# Patient Record
Sex: Male | Born: 1965 | Race: White | Hispanic: No | Marital: Married
Health system: Midwestern US, Community
[De-identification: ages and names within clinical notes are randomized; demographics above are authoritative.]

## PROBLEM LIST (undated history)

## (undated) DIAGNOSIS — T7840XA Allergy, unspecified, initial encounter: Secondary | ICD-10-CM

## (undated) DIAGNOSIS — Z9289 Personal history of other medical treatment: Secondary | ICD-10-CM

## (undated) DIAGNOSIS — Z973 Presence of spectacles and contact lenses: Secondary | ICD-10-CM

## (undated) DIAGNOSIS — K219 Gastro-esophageal reflux disease without esophagitis: Secondary | ICD-10-CM

## (undated) DIAGNOSIS — G5603 Carpal tunnel syndrome, bilateral upper limbs: Secondary | ICD-10-CM

## (undated) DIAGNOSIS — I7122 Aneurysm of the aortic arch, without rupture: Secondary | ICD-10-CM

## (undated) DIAGNOSIS — I2510 Atherosclerotic heart disease of native coronary artery without angina pectoris: Principal | ICD-10-CM

## (undated) DIAGNOSIS — Z1211 Encounter for screening for malignant neoplasm of colon: Secondary | ICD-10-CM

## (undated) DIAGNOSIS — S83242A Other tear of medial meniscus, current injury, left knee, initial encounter: Secondary | ICD-10-CM

## (undated) DIAGNOSIS — M75121 Complete rotator cuff tear or rupture of right shoulder, not specified as traumatic: Secondary | ICD-10-CM

## (undated) DIAGNOSIS — S83207A Unspecified tear of unspecified meniscus, current injury, left knee, initial encounter: Secondary | ICD-10-CM

## (undated) DIAGNOSIS — E7800 Pure hypercholesterolemia, unspecified: Secondary | ICD-10-CM

## (undated) HISTORY — DX: Gastro-esophageal reflux disease without esophagitis: K21.9

## (undated) HISTORY — DX: Personal history of other medical treatment: Z92.89

## (undated) HISTORY — DX: Presence of spectacles and contact lenses: Z97.3

## (undated) HISTORY — DX: Allergy, unspecified, initial encounter: T78.40XA

---

## 1998-05-10 ENCOUNTER — Emergency Department (HOSPITAL_COMMUNITY): Admission: EM | Admit: 1998-05-10 | Discharge: 1998-05-10 | Payer: Self-pay | Admitting: Emergency Medicine

## 1998-11-30 ENCOUNTER — Inpatient Hospital Stay (HOSPITAL_COMMUNITY): Admission: EM | Admit: 1998-11-30 | Discharge: 1998-12-04 | Payer: Self-pay | Admitting: Emergency Medicine

## 1998-11-30 ENCOUNTER — Encounter: Payer: Self-pay | Admitting: Emergency Medicine

## 1998-12-03 ENCOUNTER — Encounter: Payer: Self-pay | Admitting: Neurology

## 1999-08-18 ENCOUNTER — Inpatient Hospital Stay: Admission: EM | Admit: 1999-08-18 | Discharge: 1999-08-20 | Payer: Self-pay | Admitting: Emergency Medicine

## 1999-08-20 ENCOUNTER — Inpatient Hospital Stay (HOSPITAL_COMMUNITY): Admission: EM | Admit: 1999-08-20 | Discharge: 1999-08-23 | Payer: Self-pay | Admitting: *Deleted

## 2003-01-07 ENCOUNTER — Emergency Department (HOSPITAL_COMMUNITY): Admission: EM | Admit: 2003-01-07 | Discharge: 2003-01-07 | Payer: Self-pay | Admitting: Emergency Medicine

## 2003-12-09 ENCOUNTER — Emergency Department (HOSPITAL_COMMUNITY): Admission: EM | Admit: 2003-12-09 | Discharge: 2003-12-09 | Payer: Self-pay | Admitting: Emergency Medicine
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
## 2006-01-22 ENCOUNTER — Emergency Department (HOSPITAL_COMMUNITY): Admission: EM | Admit: 2006-01-22 | Discharge: 2006-01-22 | Payer: Self-pay | Admitting: Emergency Medicine

## 2006-10-29 ENCOUNTER — Emergency Department (HOSPITAL_COMMUNITY): Admission: EM | Admit: 2006-10-29 | Discharge: 2006-10-29 | Payer: Self-pay | Admitting: Emergency Medicine

## 2007-03-25 ENCOUNTER — Inpatient Hospital Stay (HOSPITAL_COMMUNITY): Admission: EM | Admit: 2007-03-25 | Discharge: 2007-03-26 | Payer: Self-pay | Admitting: Emergency Medicine

## 2007-03-25 ENCOUNTER — Ambulatory Visit: Payer: Self-pay | Admitting: Cardiology

## 2007-03-25 ENCOUNTER — Ambulatory Visit: Payer: Self-pay | Admitting: *Deleted

## 2007-03-25 ENCOUNTER — Encounter (INDEPENDENT_AMBULATORY_CARE_PROVIDER_SITE_OTHER): Payer: Self-pay | Admitting: *Deleted

## 2007-03-25 DIAGNOSIS — Z9289 Personal history of other medical treatment: Secondary | ICD-10-CM

## 2007-03-25 HISTORY — DX: Personal history of other medical treatment: Z92.89

## 2007-11-20 ENCOUNTER — Observation Stay (HOSPITAL_COMMUNITY): Admission: EM | Admit: 2007-11-20 | Discharge: 2007-11-22 | Payer: Self-pay | Admitting: Emergency Medicine

## 2008-12-12 IMAGING — CT CT HEAD W/O CM
1 of 2 series · 16 of 30 positions shown, 20 images · non-contrast
Comparison: Not available

CLINICAL DATA: Syncopal episode, slurred speech, left-sided weakness.

HEAD CT WITHOUT CONTRAST
TECHNIQUE: 5mm collimated images were obtained from the base of the skull
through the vertex according to standard protocol without contrast.

[Series 3: recon 2: brain · axial · 0.49mm/px · z∈[+151,+278]mm · 16 of 56 slices shown, 20 images]
[im 3/56  brain]
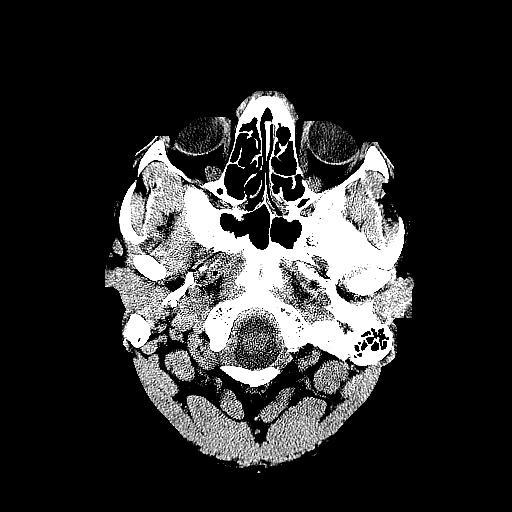
[im 3/56  bone]
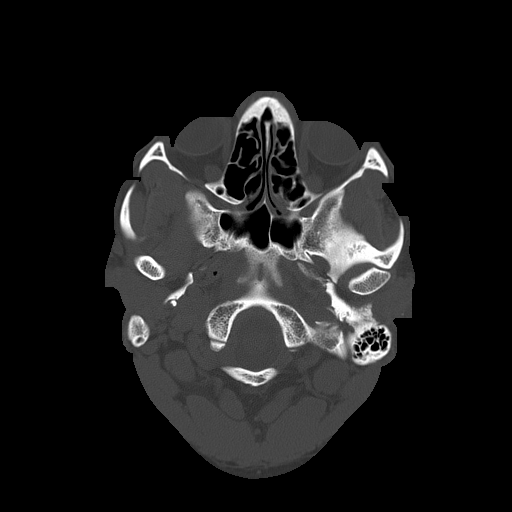
[im 6/56  brain]
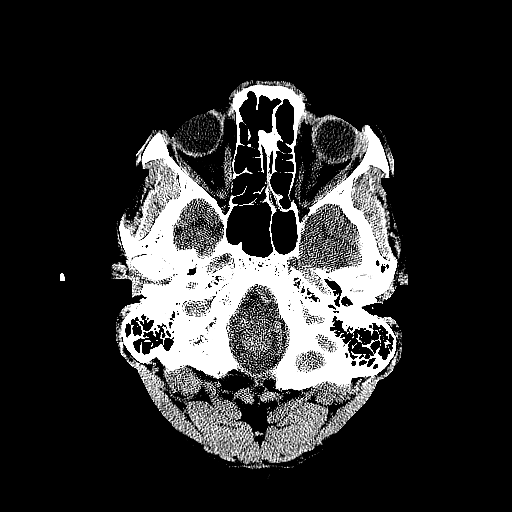
[im 9/56  brain]
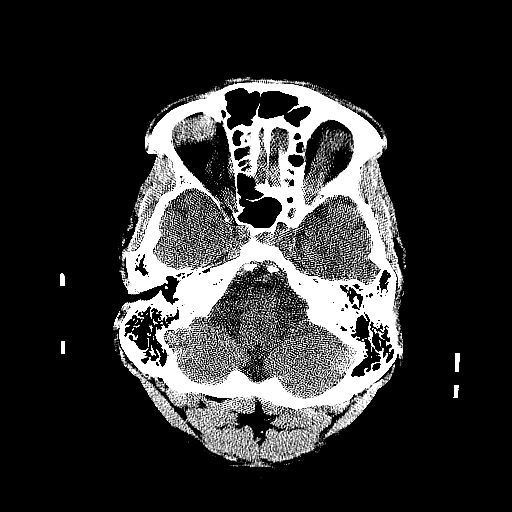
[im 12/56  brain]
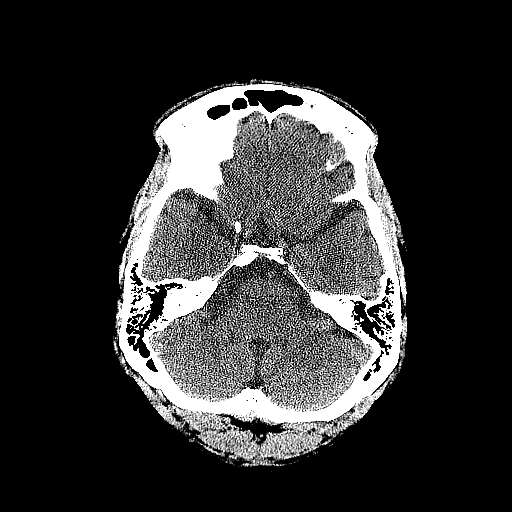
[im 18/56  brain]
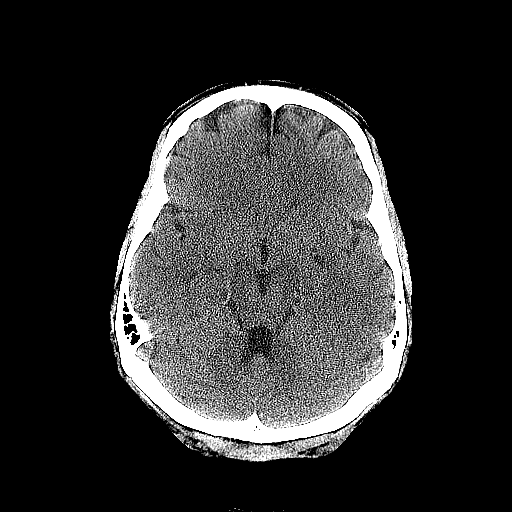
[im 18/56  bone]
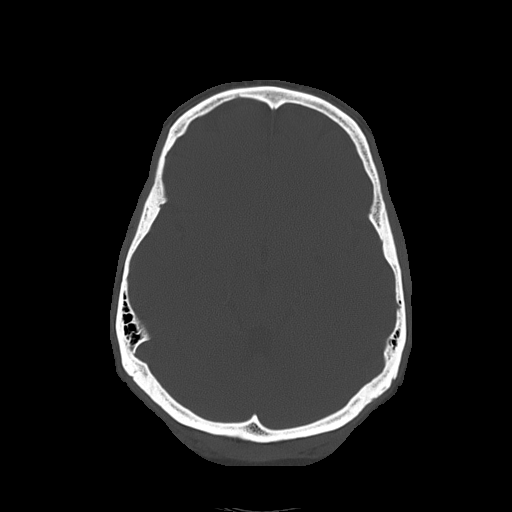
[im 21/56  brain]
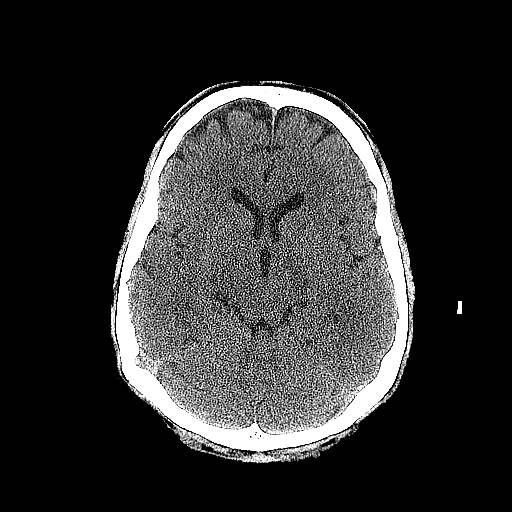
[im 24/56  brain]
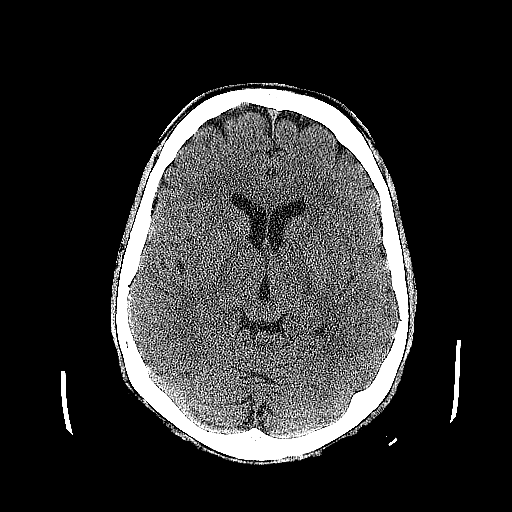
[im 27/56  brain]
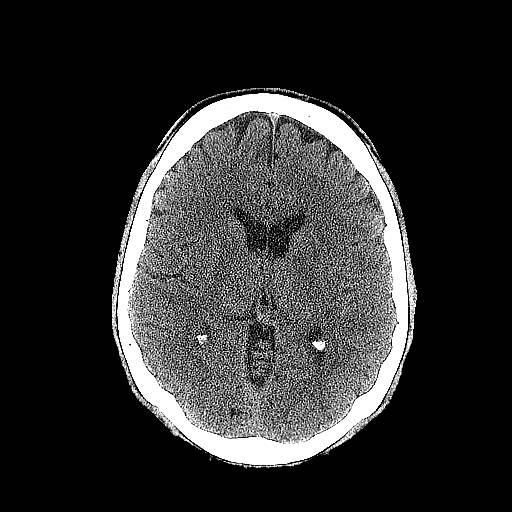
[im 29/56  brain]
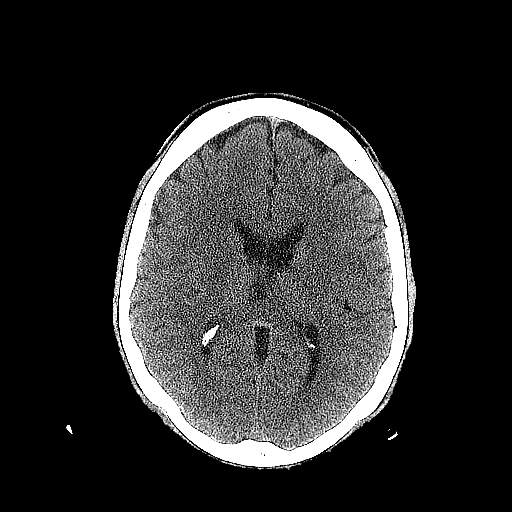
[im 29/56  bone]
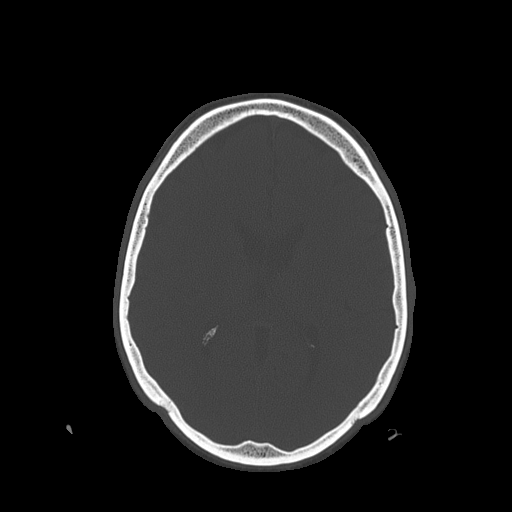
[im 32/56  brain]
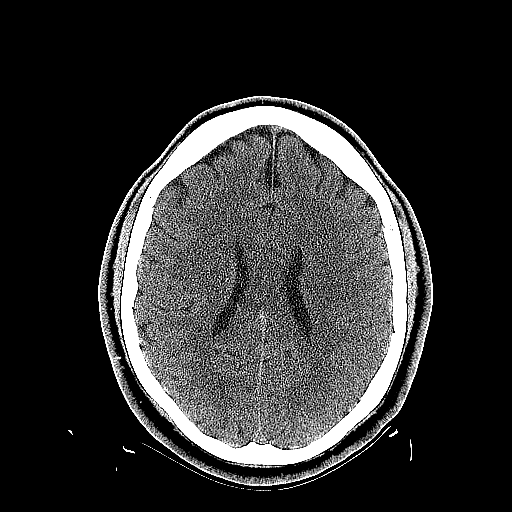
[im 35/56  brain]
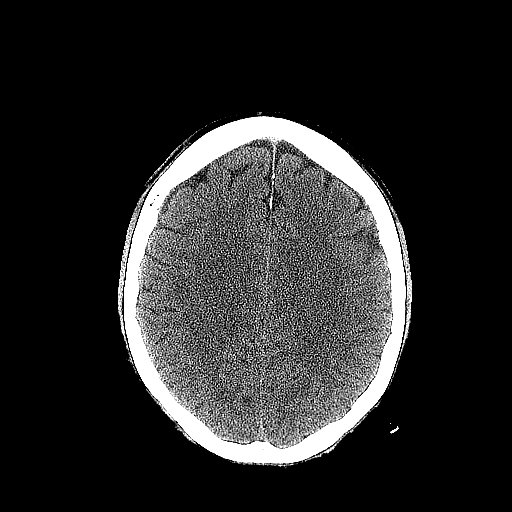
[im 38/56  brain]
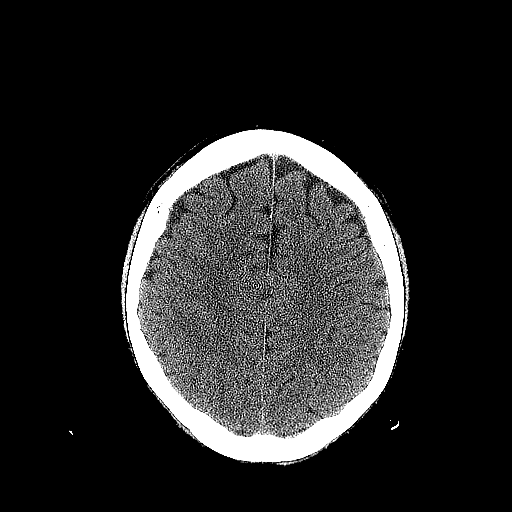
[im 44/56  brain]
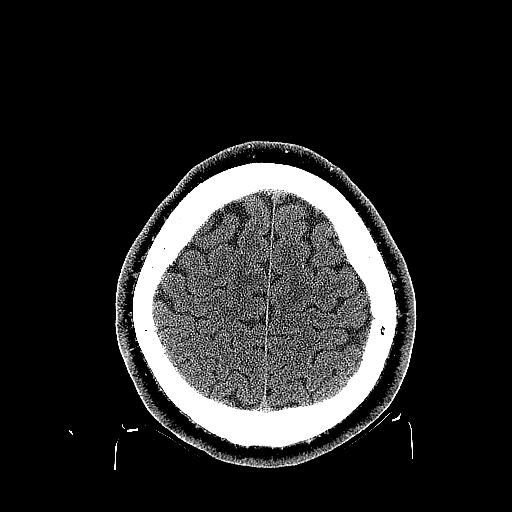
[im 44/56  bone]
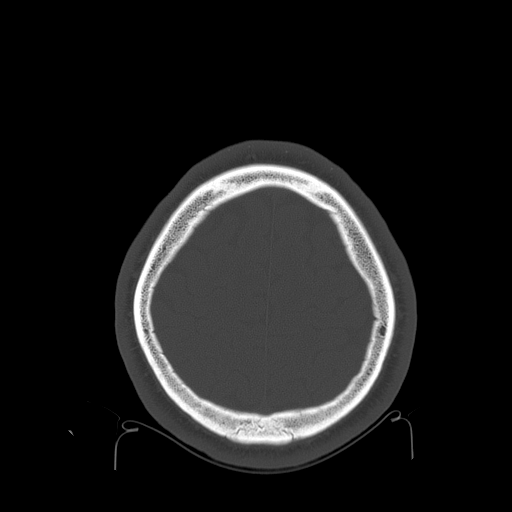
[im 47/56  brain]
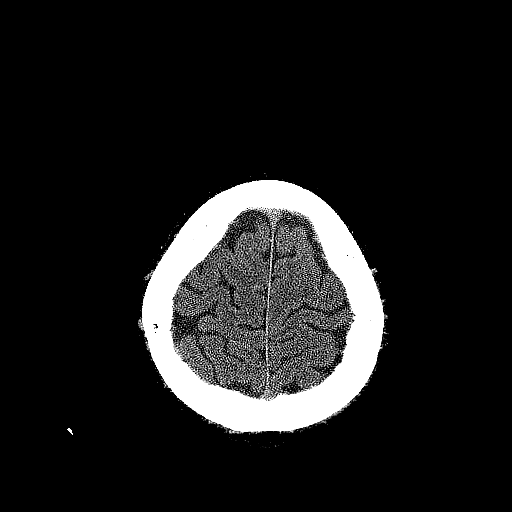
[im 50/56  brain]
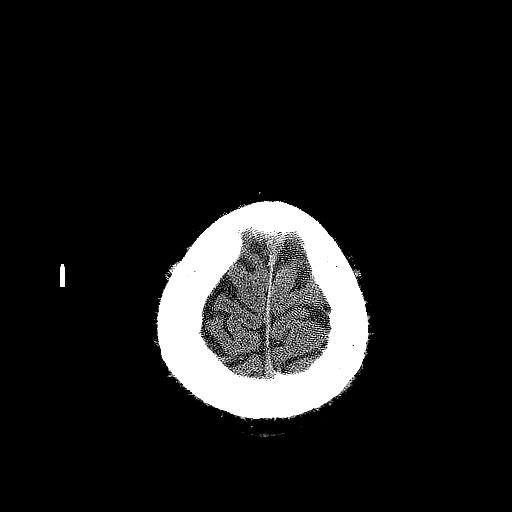
[im 53/56  brain]
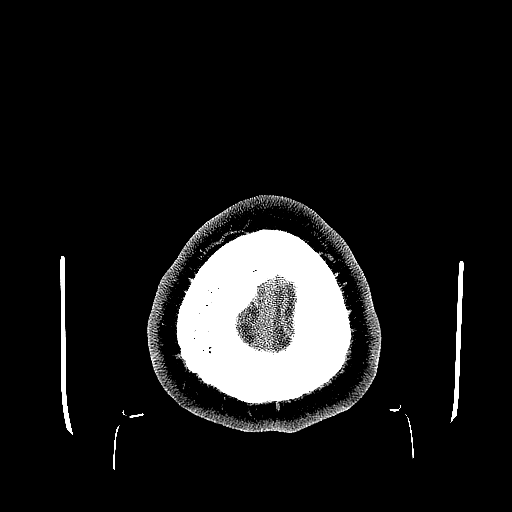

[16 of 30 positions shown; findings below may reference images not displayed]

FINDINGS: No mass, mass-effect, midline shift, hydrocephalus, or hemorrhage.
Ventricular system within normal limits. Benign basal ganglia calcifications.
Mild ethmoid mucosal is thickening.

IMPRESSION

No acute intracranial abnormality.

## 2010-01-30 ENCOUNTER — Emergency Department (HOSPITAL_COMMUNITY): Admission: EM | Admit: 2010-01-30 | Discharge: 2010-01-30 | Payer: Self-pay | Admitting: Emergency Medicine

## 2010-07-04 LAB — BASIC METABOLIC PANEL
Calcium: 9.3 mg/dL (ref 8.4–10.5)
GFR calc non Af Amer: 46 mL/min — ABNORMAL LOW (ref >60)
Potassium: 3.3 mEq/L — ABNORMAL LOW (ref 3.5–5.1)
Sodium: 138 mEq/L (ref 135–145)

## 2010-07-04 LAB — POCT CARDIAC MARKERS
CKMB, poc: 1.7 ng/mL (ref 1.0–8.0)
Myoglobin, poc: 109 ng/mL (ref 12–200)

## 2010-07-04 LAB — DIFFERENTIAL
Basophils Absolute: 0 10*3/uL (ref 0.0–0.1)
Eosinophils Absolute: 0.3 10*3/uL (ref 0.0–0.7)
Eosinophils Relative: 3 % (ref 0–5)
Lymphs Abs: 3.8 10*3/uL (ref 0.7–4.0)
Monocytes Absolute: 0.8 10*3/uL (ref 0.1–1.0)
Monocytes Relative: 9 % (ref 3–12)
Neutro Abs: 4.2 10*3/uL (ref 1.7–7.7)
Neutrophils Relative %: 46 % (ref 43–77)

## 2010-07-04 LAB — CBC
MCH: 30.1 pg (ref 26.0–34.0)
MCHC: 34.5 g/dL (ref 30.0–36.0)
MCV: 87 fL (ref 78.0–100.0)
Platelets: 300 10*3/uL (ref 150–400)
RDW: 12.7 % (ref 11.5–15.5)

## 2010-09-03 NOTE — Discharge Summary (Signed)
NAME:  Thomas Harvey, Thomas Harvey NO.:  [id number]   MEDICAL RECORD NO.:  [id number]          PATIENT TYPE:  OBV   LOCATION:  5526                         FACILITY:  [facility]   PHYSICIAN:  Eduard Clos, MDDATE OF BIRTH:  Jun 06, 1965   DATE OF ADMISSION:  11/20/2007  DATE OF DISCHARGE:                               DISCHARGE SUMMARY   COURSE IN HOSPITAL:  A 45 year old male with nonsignificant past medical  history was brought into ER.  The patient had a syncopal episode along  with shortness of breath and swelling of his face after he had a bee  sting.  The patient on admission was found to be mildly drowsy, as he  had received some Benadryl.  The patient was observed on a telemetry  floor with IV Solu-Medrol, Zantac, and Benadryl.  At this time, more  than 24 hours have lapsed and the patient is asymptomatic.  Denies any  shortness of breath, difficulty swallowing.  There is no facial  swelling, lip swelling, or tongue swelling; and the patient is eager to  go home.  I did discuss with patient in detail about EpiPen.  I advised  him to use it; however, he has a bee sting as earlier signs of  anaphylaxis.  The patient has agreed to do so and we also have advised  the patient to quit drinking alcohol.   FINAL DIAGNOSES:  1. Anaphylactic reactions secondary to bee sting, which has resolved      now.  2. Chronic alcoholism.   MEDICATION ON DISCHARGE:  EpiPen auto-injector to be used p.r.n. at the  earlier signs of the anaphylaxis to bee sting.   PLAN:  The patient advised to follow with his primary care physician and  advised in detail about how to use EpiPen, and advised to quit drinking  the alcohol.      Eduard Clos, MD  Electronically Signed     ANK/MEDQ  D:  11/22/2007  T:  11/22/2007  Job:  [id number]

## 2010-09-03 NOTE — Consult Note (Signed)
NAME:  Thomas Harvey, Thomas Harvey NO.:  [id number]   MEDICAL RECORD NO.:  [id number]          PATIENT TYPE:  INP   LOCATION:  3735                         FACILITY:  [facility]   PHYSICIAN:  Jonelle Sidle, MD DATE OF BIRTH:  23-Aug-1965   DATE OF CONSULTATION:  DATE OF DISCHARGE:  03/26/2007                                 CONSULTATION   CARDIOLOGY CONSULTATION   REQUESTING PHYSICIAN:  Manning Charity, M.D.   REASON FOR CONSULTATION:  Syncope.   HISTORY OF PRESENT ILLNESS:  Mr. Disano is a 45 year old male with a  previous history of voluntary psychiatry admission in 2001 due to  depression with suicidal ideation, as well as a admitted history of  substance abuse including cocaine and marijuana as well as regular  alcohol use.  The patient denies any known history of heart disease and  is on no regular medications at home.  He is now admitted to the  hospital following an episode of apparent syncope.  In discussing this  with the patient, he clearly states that he felt fine this morning until  eating a small piece of cake that he felt got lodged near his windpipe.  He states he drank some salt water to clear this, ultimately began  coughing vigorously with some emesis.  Following this he became dizzy  and reportedly fell to the ground, although he tells me now that it is  not entirely clear he had a loss of consciousness, he just did not know  where he was for a few seconds.  EMS was summoned and he was taken to  the emergency department for further assessment.  I note a history of  recurrent dysphagia at times and he does state that he has had previous  syncope, description of which is very consistent with neurocardiogenic  syncope.  He also states that when he uses crack cocaine, he sometimes  has chest pain, although this is not typical at other times.  His  electrocardiogram available to me now shows sinus rhythm with occasional  premature ventricular complexes and  nonspecific ST changes.  His initial  cardiac markers are normal with a troponin I level less than 0.05 and he  has already undergone a 2-D echocardiogram today demonstrating a normal  left ventricular ejection fraction of 65% without regional wall motion  abnormalities and no significant valvular abnormality.  Head CT scan has  been performed and is pending.  His chest x-ray shows some chronic lung  changes without obvious active process.  On interview now the patient  denies any dizziness or chest pain and his telemetry has only revealed  occasional premature ventricular complexes with no sustained  arrhythmias.  He does admit to using drugs approximately 2 days ago and  his urine drug screen is abnormal for both cocaine metabolites and  tetrahydrocannabinol metabolites.   ALLERGIES:  NO KNOWN DRUG ALLERGIES.   MEDICATIONS:  None at home.  The patient is being treated with folate,  thiamine and Protonix here in the hospital.   PAST MEDICAL HISTORY:  Is as outlined above.  Questionable history of  asthma.   SOCIAL HISTORY:  The patient is married, lives with his wife.  Does  report having psychosocial stressors.  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  No active tobacco use.  Does have illicit substance use  history as already described.   FAMILY HISTORY:  Significant for hypertension, apparently also drug  abuse in the patient's father.  No obvious premature cardiovascular  disease.   REVIEW OF SYSTEMS:  As described in history of present illness.  Otherwise negative.   EXAMINATION:  Temperature is 98 degrees, heart rate 50s to 60s and sinus  rhythm, respirations 21, blood pressure 133/73.  The patient not  significantly orthostatic, 98% oxygen saturation on 2 liters nasal  cannula.  GENERAL:  This is a normally nourished-appearing male in no acute  distress.  HEENT:  Conjunctivae was normal.  Pharynx clear.  NECK:  Supple, no elevated jugular venous pressure, no loud  bruits.  No  thyromegaly is noted.  LUNGS:  Clear without work of breathing at rest.  CARDIAC EXAM:  Reveals a regular rate and rhythm.  No pericardial rub or  S3 gallop, no loud systolic murmur.  ABDOMEN:  Soft, nontender, normoactive bowel sounds.  EXTREMITIES:  Exhibit no pitting edema and distal pulses 2+.  SKIN:  Warm and dry.  MUSCULOSKELETAL:  No kyphosis is noted.  NEUROPSYCHIATRIC:  The patient is alert and oriented x3.  Affect  somewhat flat.   LABORATORY DATA:  WBC 6.4, hemoglobin 14.4, hematocrit 42, platelets  274.  Sodium 137, potassium 3.7, chloride 106, bicarb 27, glucose 94,  BUN 12, creatinine 1.3, AST 18, ALT 17, CK 311 with a CK-MB 3.2.  Normal  relative index and a troponin I of 0.02, myoglobin also normal at 72.9.   IMPRESSION:  1. Recent episode of syncope, likely neurocardiogenic in mechanism      following an episode of choking and emesis.  Electrocardiogram is      nonspecific, no clear dysrhythmias noted on telemetry at this      point, and initial cardiac markers are normal.  In addition 2-D      echocardiography reveals normal left systolic function without wall      motion abnormalities.  I doubt that this was related to ischemia.  2. History of polysubstance abuse including cocaine, marijuana and      alcohol.  The patient reports sometimes experiencing chest      discomfort when he uses crack cocaine but otherwise not on a      regular basis.  This habit clearly increases his risk for adverse      events, even cardiovascular events, although at this point he has      no objective evidence of ischemia.  He denies having any previous      stress testing.   RECOMMENDATIONS:  Would cycle basic cardiac markers and follow up  electrocardiogram.  I suspect that the patient's event was  neurocardiogenic in etiology as already outlined, rather than ischemic  related to dysrhythmia/cardiomyopathy based on his present evaluation.  Clearly cessation of  substance use would be in this gentleman's benefit.  I do think it would be reasonable to consider an exercise Myoview  through the [facility] Radiology Department as a matter of general risk  stratification, perhaps even as an outpatient, assuming that his markers  continue to be normal and he has no further symptoms.      Jonelle Sidle, MD  Electronically Signed     SGM/MEDQ  D:  03/25/2007  T:  03/26/2007  Job:  [id number]   cc:   Manning Charity, MD

## 2010-09-03 NOTE — H&P (Signed)
NAME:  CARTHEL, CASTILLE NO.:  [id number]   MEDICAL RECORD NO.:  [id number]          PATIENT TYPE:  OBV   LOCATION:  3301                         FACILITY:  [facility]   PHYSICIAN:  Vania Rea, M.D. DATE OF BIRTH:  02-15-1966   DATE OF ADMISSION:  11/20/2007  DATE OF DISCHARGE:                              HISTORY & PHYSICAL   PRIMARY CARE PHYSICIAN:  Unassigned.   CHIEF COMPLAINT:  Acute anaphylaxis.   HISTORY OF PRESENT ILLNESS:  This is a 45 year old African American  gentleman with a family history of asthma but no personal history of  asthma or allergies but who does suffer from episodic shortness of  breath, was stung by a bee late this evening, was treated at home with  home remedies, application of alcohol and tobacco to the area under his  left arm.  About 20 minutes later patient was found upstairs in a  collapsed state and difficulty breathing and his wife rushed him to the  emergency room.  She reports it took maybe 7 minutes to get him to the  emergency room after they found him.  He received an EpiPen, intravenous  Solu-Medrol, Benadryl and Pepcid as well as albuterol nebs in the  emergency room.  He eventually became more alert and oriented but did  not have a memory of being transported to the hospital.  Currently  somewhat drowsy, perhaps from the Benadryl but more alert from  presentation.  Patient apparently was drinking more heavily than usual.  Today he was at a cookout.  He does have a history of substance abuse  and said he last used cocaine about 2 days ago.  He is not taking any  over-the-counter or prescribed medications.   PAST MEDICAL HISTORY:  History of depression and suicidal ideation, but  his wife says this is remote.   MEDICATIONS:  None.   ALLERGIES:  No known drug allergies.   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  Uses alcohol  very occasionally, he says a beer occasionally.  Does appear to use  cocaine regularly.  Denies tobacco abuse.   FAMILY HISTORY:  Significant for a father with substance abuse, a  brother with asthma, a strong family history of hypertension, no family  history of cardiac disease.   REVIEW OF SYSTEMS:  Per his wife, is significant for recurrent episodes  of vomiting and episodically blood in this stool but he is resistant to  visit a doctor.  Also has episodes of shortness of breath, especially  with exertion, and was seen at this facility about 7 months ago for an  episode of syncope but 2D echocardiogram done at that time was normal.   PHYSICAL EXAM:  A mildly obese African American gentleman lying flat on  the couch, somewhat disoriented by the recent events.  VITALS:  His temperature is 98, his pulse is 110, his blood pressure is  143/83.  PUPILS:  Are round and equal.  Mucous membranes are pink and anicteric.  He  has no cervical lymphadenopathy, no thyromegaly, no jugular venous  distention.  His throat is red and swollen but he is breathing normally.  There is no current obstruction.  CHEST:  Clear to auscultation bilaterally, there is no wheezing.  CARDIOVASCULAR SYSTEM:  Is tachycardic with no murmur or gallop.  ABDOMEN:  Mildly obese, soft and nontender.  There are no masses.  EXTREMITIES:  Without edema.  His pulses are 2+ bilaterally.  CENTRAL NERVOUS SYSTEM:  Cranial nerves II-XII are grossly intact.  He  has no focal neurologic deficit.   No labs have been drawn at this time.  His EKG shows sinus tachycardia  with T wave inversions in leads V3 through V6 as well as lead 1.   ASSESSMENT:  1. Acute anaphylactic reaction.  2. Polysubstance abuse.   PLAN:  Will bring this gentleman in for observation and for continued  administration of anti-allergic medications including H1 and H2 blockers  as well as Solu-Medrol.  Will also keep him hydrated.  Will check his  serum  chemistry and a chest x-ray.  Other plans as per orders.      Vania Rea, M.D.  Electronically Signed     LC/MEDQ  D:  11/20/2007  T:  11/21/2007  Job:  [id number]

## 2010-09-03 NOTE — Discharge Summary (Signed)
NAME:  Thomas Harvey, Thomas Harvey NO.:  [id number]   MEDICAL RECORD NO.:  [id number]          PATIENT TYPE:  INP   LOCATION:  3735                         FACILITY:  [facility]   PHYSICIAN:  Manning Charity, MD     DATE OF BIRTH:  Jul 14, 1965   DATE OF ADMISSION:  03/25/2007  DATE OF DISCHARGE:  03/26/2007                               DISCHARGE SUMMARY   DISCHARGE DIAGNOSES:  1. Dizziness with syncope.  2. Nausea and vomiting.  3. Left sided weakness.  4. Dysphagia.  5. Substance abuse.   DISCHARGE MEDICATIONS:  The patient is being discharged on Protonix 40  mg one tablet daily.   DISCHARGE CONDITION:  The patient is being discharged in stable  condition.   FOLLOW UP:  He will follow-up with Dr. Clent Ridges at the [facility] on December 11 at 2 p.m.  He will report to [facility]  Cardiology for a treadmill stress test on December 18 at 11 a.m.   PROCEDURES PERFORMED DURING THIS HOSPITALIZATION:  March 25, 2007:  Two view chest x-ray shows chronic lung changes, no acute findings or  significant change compared to chest x-ray of December 09, 2006.  March 25, 2007:  CT head no contrast.  No acute intracranial  abnormalities.  March 25, 2007:  2D echo.  Overall left ventricular systolic function  normal with ejection fraction of 65%.  No left ventricular wall  abnormalities.  Left ventricular wall thickness upper limits of normal.  Normal aortic valve, normal mitral valve.   CONSULTATIONS DURING THIS HOSPITALIZATION:  Cardiology.  The patient was  seen by Jonelle Sidle, MD who recommend cycling cardiac markers.  He felt that the patient's syncopal event was neurocardiogenic rather  than ischemic.  Recommend exercise Myoview.   ADMITTING HISTORY AND PHYSICAL:  This is a 45 year old male with history  of psychiatric admission in May of 2001 with suicidal ideation and  current cocaine use, last use 6 p.m. day prior to admission, who  presents in [facility]  Emergency Department after choking on a piece of  cake this morning after which he coughed, vomited three times.  Last  episode showed blood tinge.  He then became dizzy and fell to the  ground.  He did not lose consciousness but reports feeling weak in his  knees after which his wife called EMS.  The patient remembers being  transported by the ambulance.  He feels he had an episode of syncope in  the ambulance and was aroused with sternal rub.  He reports slurring of  speech, headache and left sided weakness during this second episode of  syncope.  The patient reports a long history of dysphagia, difficulty  swallowing food, history of regurgitating undigested food, sour taste in  mouth and occasional heartburn.  He also recalls dark colored stools  intermittently with bright red blood per rectum, more so in the past  several days.  He is being admitted for evaluation of syncope and  dysphagia.  No history of abdominal pain, fever, bowel or bladder  interval.   ADMISSION VITALS  At  time of admission temperature 98.2, blood pressure 133/78, pulse 58,  respirations 21.  The patient is sating 98% on two liters nasal cannula.  Orthostatic vital signs are negative.   PHYSICAL EXAM  Exam shows the patient not in acute distress.  Pupils equal, round and  reactive to light.  Extraocular movements intact.  Oropharynx clear.  No  erythema or exudate.  Neck is supple.  Lung exam clear to auscultation  bilaterally.  Cardiovascular exam is bradycardic, regular rhythm, no  murmurs, gallops or rubs.  Abdomen is soft, nontender, with bowel sounds  positive.  Some epigastric tenderness.  Rectal exam shows normal tone,  brown stool, hemoccult negative.  There is no edema of the lower  extremities.  There are no rashes.  The patient is alert and oriented  x3.  Cranial nerves II-XII intact.  Reflexes appropriate bilaterally.  Strength 5/5 bilaterally.  Sensation intact bilaterally.  Finger nose to   finger test normal, heel to shin normal, cerebellar function normal.  Psychiatric:  Disposition is appropriate.   LABORATORY DATA:  Sodium 137, potassium 3.5, chloride 104.  Bicarb 26,  BUN 15, creatinine 1.45, glucose 99.  Zavalza blood cell count 6.4.  Hemoglobin 14.1, hematocrit 41.6, platelets 281, MCV 88.1.  Calcium 8.9.  Cardiac markers show an elevated creatinine kinase, otherwise negative  x3.  Urine drug screen positive for cocaine and THC.  TSH 0.462, within  normal limits for this hospital.   HOSPITAL COURSE:  1. Dizziness with syncope.  Suspect vasovagal etiology secondary to      emesis.  There was no recurrence of symptoms during this      hospitalization.  CT scan of head is negative.  There were no      changes seen on EKG during this hospitalization.  2D echocardiogram      is normal.  Cardiac enzymes negative x3.  The patient will be      scheduled for an exercise stress test in the outpatient setting.  2. Left sided weakness.  The patient reported left sided weakness in      ambulance on transport to hospital.  Again reported left sided      weakness during hospitalization.  Exam, however, not consistent.      Left sided weakness also seemed to be sporadic throughout the      hospitalization, and more attributable to poor effort than loss of      function.  The patient was able to ambulate and grip when casually      observed.  3. Nausea, Vomiting and Dysphagia. Nausea and vomiting did not persist      during hospitalization. Given the one year history of difficulty      swallowing, and regurgitation, patient  may need GI follow-up for      barium swallow.  Will discharge on a PPI given history of GERD like      symptoms.  Will discuss continuation of symptoms in the outpatient      setting.  4. Substance abuse.  The patient reports cocaine use three to four      times a week and up to $400 a week spent on cocaine.  The patient      has to work two jobs to maintain  this habit, however, he reports he      feels able to stop anytime and declined assistance in quitting.      This should again be discussed in the outpatient setting.   The  day of discharge vitals:  Temperature 97.6, pulse 58, blood pressure  119/71, respirations 16.  Patient sating 94% on room air.  Abdullah blood  cell count 5.6, hemoglobin 13.6, hematocrit 40, platelet 254, sodium  141, potassium 3.5, chloride 109, bicarb 27, BUN 9, creatinine 1.5,  glucose 95.      Elby Showers, MD  Electronically Signed      Manning Charity, MD  Electronically Signed   CW/MEDQ  D:  03/31/2007  T:  04/01/2007  Job:  [id number]

## 2010-09-06 NOTE — Discharge Summary (Signed)
[facility]  Patient:    Thomas Harvey, Thomas Harvey                      MRN: [id number] Adm. Date:  [id number] Disc. Date: [id number] Attending:  Otilio Saber                           Discharge Summary  HISTORY OF PRESENT ILLNESS:  Mr. Thomas Harvey is a 45 year old, single, black male voluntarily admitted for depression with suicidal ideation to shoot himself. Also, the patient has a history of cocaine abuse and dependency.  On August 18, 1999, the patient presented to the [facility] Emergency Room, complaining of chest pain while coming off crack cocaine.  He reportedly had been using cocaine daily up to about $800 a week.  He also uses marijuana daily.  He states that his longest period of sobriety was in the year 2000 and that was five months prior.  He has been using cocaine for 10-15 years.  He has decreased sleep with frequent awakenings.  He has lost approximately 10 pounds in the past month.  He states that he does not like the way he has been living, that he has been womanizing and has no real security.  He has children by different women.  He feels like he does not like his life.  He has no previous suicide attempts.  He has cravings for cocaine for cocaine at present.  He is verbalizing a lot of motivation for sobriety and denies current suicidal ideation.  He has previously been treated [facility] on an outpatient basis for cocaine addiction in the years 2000 and 2001.  His primary care physician is Dyanne Carrel, M.D.  Dr. Manus Gunning was his admitting physician to [facility] for stabilization of his chest pain.  His medical problems include chest pain while coming off of crack cocaine.  It was felt to be musculoskeletal in origin.  CURRENT MEDICATIONS: 1. Aspirin. 2. Paxil 10 mg q.d. 3. Vistaril 50 mg q.h.s. p.r.n.  DRUG ALLERGIES:  He has no known allergies.  ADMITTING MEDICATIONS: 1. Paxil 10 mg q.d. 2. Vistaril 50 mg p.o. q.h.s.  p.r.n. for insomnia. 3. Seroquel 25 mg p.o. q.4h. p.r.n. for agitation.  PHYSICAL EXAMINATION:  The physical examination was performed at the [facility] Emergency Room with significant findings of chest pain, as well as an abnormal ECG.  He was transferred to the ______ floor for stabilization.  He presents today on the unit in no acute distress and no significant findings.  MENTAL STATUS EXAMINATION:  Appearance and Behavior:  He is a casually dressed black male.  He is very cooperative and polite.  His speech is normal rate and tone and relevant.  His mood is somewhat depressed.  His affect was within normal range.  His thought processes are logical and coherent without evidence of psychoses.  He denies suicidal ideation, homicidal ideation, or history of auditory and visual hallucinations.  Again, as noted in the HPI, he has expressed ideation with a plan to shoot himself.  The patient does have access to a gun, but he has not history of self-harm.  Cognitive processes appear to be intact.  He is alert and oriented x 4.  He presents with adequate insight, judgment, and motivation for sobriety.  ADMISSION DIAGNOSES: Axis I.    Major depression, recurrent, with substance dependency and  substance abuse. Axis II.   None. Axis III.  History of chest pain related to cocaine abuse. Axis IV.   Psychosocial stressors:  Severe related to problems with primary            support group, housing problems, and psychosocial problems with            cocaine abuse and dependency. Axis V.    The current global assessment of functioning is 35.  The highest in            the past year is 25.  LABORATORY DATA:  The patient presented with an abnormal ECG.  He had ST changes with marked bradycardia.  He had an elevated CK at 438, however, his cardiac enzymes were within normal limits.  His chest x-ray showed no active disease.  His urine drug screen was positive for cocaine and  marijuana. Additionally, thyroid testing was performed while on the unit and it was found to be within normal limits.  His urinalysis was within normal limits.  HOSPITAL COURSE:  The patient was admitted to [facility] on a voluntary basis for treatment of his depression and subsequent cocaine abuse.  He was initially continued on his Paxil 10 mg a day and Vistaril 50 mg q.h.s. and Seroquel 25 mg q.4h. p.r.n. was initiated for agitation.  The patient had only received two doses of Paxil.  After discussing some of his symptoms with the patient, we subsequently discontinued that and started him on Wellbutrin XR 100 mg q.a.m.  While in the hospital, the patient continued to verbalize some sleep disturbances, as well as some slight depression, and Remeron 30 mg q.h.s. was added, as well as increasing his Wellbutrin 150 mg SR q.a.m.  He progressed very well in regards to his detoxification from cocaine and stabilization of his depression.  He was very active on the milieu, working 12-step programs, and verbalizing a lot of motivation for sobriety.  He denied suicidal ideation and appearing a lot more hopeful about the future.  His sleep and appetite had improved and he was showing no medication intolerances.  It was felt that he could be managed on an outpatient basis at this time.  CONDITION ON DISCHARGE:  The patient was discharged in improved condition with regard to his mood, sleep, and appetite.  There was alleviation of any signs and symptoms of cocaine withdrawal.  He was denying cravings and he was denying any further chest pain.  There was improvement in his energy.  The patient was verbalizing readiness for discharge.  DISPOSITION:  The patient was discharged home.  FOLLOW-UP:  The patient will follow up at [facility] on Friday, Aug 30, 1999, as well as the [facility], for long-term treatment.  DISCHARGE  MEDICATIONS: 1. Remeron 30 mg q.h.s. 2. Wellbutrin SR 150 mg q.d. 3. Vistaril 50 mg q.h.s. p.r.n. if needed.  FINAL DIAGNOSES:  Axis I.    Major depression, recurrent, with substance abuse and dependency. Axis II.   None. Axis III.  History of chest pain related to cocaine abuse. Axis IV.   Psychosocial stressors:  Moderate related to problems with primary            support group, housing problems, and psychosocial problems of            cocaine abuse and dependency. Axis V.    The current global assessment of functioning is 50.  The highest in  the past year is 25. DD:  08/30/99 TD:  09/03/99 Job: [id number]

## 2010-09-06 NOTE — H&P (Signed)
[facility]  Patient:    TAVIS, KRING                      MRN: [id number] Adm. Date:  [id number] Attending:  Otilio Saber Dictator:   Eduard Roux, N.P.                   Psychiatric Admission Assessment  DATE OF ADMISSION:  Aug 20, 1999  PATIENT IDENTIFICATION:  Mr. Alcorn is a 45 year old single black male, voluntarily admitted for depression with suicidal ideation to shoot himself. Also, patient has a history of cocaine abuse and dependency.  HISTORY OF PRESENT ILLNESS:  On April 29 the patient presented to [facility] Emergency Room complaining chest pain while coming off of crack cocaine.  He reportedly has been using cocaine daily up to about $800 dollars a week.  He also uses marijuana daily.  He states his longest period of sobriety was in the year 2000, that was five months prior.  He has been using cocaine for 10-15 years.  He has decreased sleep with frequent awakenings and he has lost about 10 pounds in the past month.  He states he does not like the way he has been living, that he has been womanizing and has no real security.  He has children by different women and he feels like he does not like his life.  He has no previous suicide attempts. He denies cravings for cocaine at present. He is verbalizing a lot of motivation for sobriety and denies current suicidal ideation.  PAST PSYCHIATRIC HISTORY:  He is being treated at [facility] on an outpatient basis for cocaine addiction in the year 2000 and 2001.  SOCIAL HISTORY:  He is divorced.  He has four children by different women.  He has lived in [facility] in the past.  Currently, he is homeless.  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  He is homeless.  FAMILY HISTORY:  He has a lengthy family history of alcoholism and drug abuse, most notably his father is alcoholic and current drug addict.  ALCOHOL AND DRUG HISTORY:  He has been using cocaine daily for  10-15 years, approximately $800 a week.  He drinks two to three times a week and he smokes marijuana daily.  PRIMARY CARE PHYSICIAN:  Dr. Manus Gunning.  Dr. Manus Gunning was his admitting physician to [facility] for stabilization of his chest pain.  MEDICAL PROBLEMS:  Chest pain while coming off of crack cocaine, felt to be musculoskeletal in origin.  MEDICATIONS: 1. Aspirin. 2. Paxil 10 mg q.d.  The patient has only had two doses of this medication. 3. Vistaril 50 mg q.h.s. p.r.n.  DRUG ALLERGIES:  No known allergies.  PHYSICAL EXAMINATION:  Physical examination was performed at [facility] Emergency Room with significant findings of chest pain as well as abnormal ECG.  He had ST changes with marked bradycardia.  He has elevated CK at 438; however, his cardiac enzymes were within normal limits.  His chest x-ray showed no active disease.  His urine drug screen was positive for cocaine and marijuana.  He was presented to the med-psych floor for stabilization.  MENTAL STATUS EXAMINATION:  Appearance and behavior:  A casually dressed black male.  He is very cooperative and polite.  His speech is normal rate and tone and relevant.  His mood is somewhat depressed.  His affect is within normal range.  His thought  processes are logical and coherent without evidence of psychoses.  He denies suicidal ideation, homicidal ideation or a history of auditory and visual hallucinations.  Again, as noted in the HPI, he has expressed suicidal ideation with a plan to shoot himself.  The patient does have access to a gun but he has no history of self-harm.  Cognitive processes appear to be intact.  He is alert and oriented x 4.  He possessed an adequate insight and judgment and motivation for sobriety.  ADMISSION DIAGNOSES: Axis I:    Major depression, recurrent substance dependency and substance            abuse. Axis II:   None. Axis III:  History of chest pain related to cocaine abuse. Axis  IV:   Severe related to problems with primary support group, housing            problems and psychosocial problems of cocaine abuse and dependency. Axis V:    Current GAF is 35, highest past year is 65.  TREATMENT PLAN AND RECOMMENDATIONS:  We will voluntarily admit Mr. Hesch to [facility] for stabilization and provide 15 minute checks for safety.  At this time we will discontinue the Paxil and start Wellbutrin SR 100 mg q.d.  There is some indications that Wellbutrin is good for not only the depression but people with addictive issues.  Vistaril 50 mg q.h.s. we will continue and we will also offer Seroquel 25 mg q.4h. p.r.n. for complaints of anxiety.  The patient wishes to pursue long-term treatment such as [facility] or the [facility].  We will approach case management about conferring with patient on this.  TENTATIVE LENGTH OF STAY AND DISCHARGE PLAN:  Two to three days with follow up at [facility] as well as long-term placement. DD:  08/21/99 TD:  08/22/99 Job: [id number]

## 2010-12-14 ENCOUNTER — Emergency Department (HOSPITAL_COMMUNITY)
Admission: EM | Admit: 2010-12-14 | Discharge: 2010-12-14 | Disposition: A | Discharge status: Home / Self Care | Payer: No Typology Code available for payment source | Attending: Emergency Medicine | Admitting: Emergency Medicine

## 2010-12-14 ENCOUNTER — Emergency Department (HOSPITAL_COMMUNITY): Payer: Self-pay

## 2010-12-14 DIAGNOSIS — M25569 Pain in unspecified knee: Secondary | ICD-10-CM | POA: Insufficient documentation

## 2010-12-14 DIAGNOSIS — R51 Headache: Secondary | ICD-10-CM | POA: Insufficient documentation

## 2010-12-14 DIAGNOSIS — S8000XA Contusion of unspecified knee, initial encounter: Secondary | ICD-10-CM | POA: Insufficient documentation

## 2010-12-14 DIAGNOSIS — IMO0002 Reserved for concepts with insufficient information to code with codable children: Secondary | ICD-10-CM | POA: Insufficient documentation

## 2010-12-30 MED ORDER — AZITHROMYCIN 250 MG TAB
250 mg | ORAL_TABLET | ORAL | Status: AC
Start: 2010-12-30 — End: 2011-01-04

## 2010-12-30 NOTE — Progress Notes (Signed)
Jason Shah is a 45 y.o. male     SUBJECTIVE:    Patient  C/o two weeks ago chest cold treated with antibiotics for 2 days from elsewhere.  Has minor lingering non productive cough.   Now with rt ear  Congestion x 2 weeks and sore throat  X 1 week.   Past Medical History   Diagnosis Date   ??? Eczema 12/30/2010   ??? Depression 12/30/2010   ??? Bronchitis 12/30/2010     Current Outpatient Prescriptions   Medication Sig Dispense Refill   ??? naproxen sodium (ALEVE) 220 mg Cap Take  by mouth.         ??? azithromycin (ZITHROMAX) 250 mg tablet Take 2 tablets today, then take 1 tablet daily.  6 Tab  0     No Known Allergies  .  History   Smoking status   ??? Never Smoker    Smokeless tobacco   ??? Not on file       Review of Systems  Review of Systems - negative except as per HPI      Physical Examination:  Temp 98.6 ??F (37 ??C)   Ht 5\' 11"  (1.803 m)   Wt 215 lb (97.523 kg)   BMI 29.99 kg/m2  Physical Exam    General: Appears in no acute distress  ENT : tm-clear ; throat : Mild erythema. No neck nodes felt;  neck supple  Lung-clear  Heart -reg  Abdomen -  Extremities -     . No results found for this or any previous visit.     Assessment/Plan  1. URI (upper respiratory infection)    2. Eustachian tube dysfunction    ERX Zpack   Claritin prn Eustachian tube dysfunction.  Follow up PRN.    Author:  Ysidro Evert, MD 12:13 PM9/01/2011

## 2010-12-30 NOTE — Patient Instructions (Signed)
MyChart Activation    Thank you for requesting access to MyChart. Please follow the instructions below to securely access and download your online medical record. MyChart allows you to send messages to your doctor, view your test results, renew your prescriptions, schedule appointments, and more.    How Do I Sign Up?    1. In your internet browser, go to [email or web address]  2. Click on the First Time User? Click Here link in the Sign In box. You will be redirect to the New Member Sign Up page.  3. Enter your MyChart Access Code exactly as it appears below. You will not need to use this code after you???ve completed the sign-up process. If you do not sign up before the expiration date, you must request a new code.    MyChart Access Code: [email or web address]  Expires: 03/30/2011 12:22 PM (This is the date your MyChart access code will expire)    4. Enter the last four digits of your Social Security Number (xxxx) and Date of Birth (mm/dd/yyyy) as indicated and click Submit. You will be taken to the next sign-up page.  5. Create a MyChart ID. This will be your MyChart login ID and cannot be changed, so think of one that is secure and easy to remember.  6. Create a MyChart password. You can change your password at any time.  7. Enter your Password Reset Question and Answer. This can be used at a later time if you forget your password.   8. Enter your e-mail address. You will receive e-mail notification when new information is available in MyChart.  9. Click Sign Up. You can now view and download portions of your medical record.  10. Click the Download Summary menu link to download a portable copy of your medical information.    Additional Information    If you have questions, please visit the Frequently Asked Questions section of the MyChart website at [email or web address] Remember, MyChart is NOT to be used for urgent needs. For medical emergencies, dial 911.       Claritin , one daily as needed for ear congestion   Zpack   Call if not improving.

## 2011-01-17 LAB — CBC
HCT: 42.1
MCHC: 33
MCHC: 34
MCV: 89.7
Platelets: 245
Platelets: 257

## 2011-01-17 LAB — COMPREHENSIVE METABOLIC PANEL
ALT: 17
ALT: 22
AST: 25
Albumin: 3.4 — ABNORMAL LOW
CO2: 26
Chloride: 107
GFR calc Af Amer: >60
GFR calc non Af Amer: 56 — ABNORMAL LOW
GFR calc non Af Amer: 60 — ABNORMAL LOW
Glucose, Bld: 140 — ABNORMAL HIGH
Sodium: 141
Total Bilirubin: 0.7
Total Protein: 6.7

## 2011-01-17 LAB — DIFFERENTIAL
Basophils Absolute: 0
Basophils Relative: 0
Eosinophils Absolute: 0
Eosinophils Relative: 1
Lymphocytes Relative: 26
Lymphs Abs: 1.9
Monocytes Absolute: 0.2
Monocytes Relative: 2 — ABNORMAL LOW
Neutro Abs: 5.4
Neutrophils Relative %: 71

## 2011-01-27 LAB — TROPONIN I: Troponin I: 0.02

## 2011-01-27 LAB — BASIC METABOLIC PANEL
BUN: 15
BUN: 9
CO2: 26
Calcium: 8.9
Chloride: 104
Chloride: 109
Creatinine, Ser: 1.45
GFR calc Af Amer: >60
GFR calc non Af Amer: 51 — ABNORMAL LOW
GFR calc non Af Amer: 53 — ABNORMAL LOW
Glucose, Bld: 95
Potassium: 3.5
Sodium: 137
Sodium: 141

## 2011-01-27 LAB — BASIC METABOLIC PANEL WITH GFR
Glucose, Bld: 99
Potassium: 3.5

## 2011-01-27 LAB — COMPREHENSIVE METABOLIC PANEL
ALT: 17
AST: 18
Alkaline Phosphatase: 66
CO2: 27
Chloride: 106
GFR calc Af Amer: >60
GFR calc non Af Amer: 57 — ABNORMAL LOW
Glucose, Bld: 94
Potassium: 3.7
Sodium: 137

## 2011-01-27 LAB — TSH: TSH: 0.462

## 2011-01-27 LAB — DIFFERENTIAL
Basophils Absolute: 0.1
Basophils Relative: 1
Eosinophils Absolute: 0.2
Eosinophils Relative: 3
Lymphocytes Relative: 29
Lymphs Abs: 1.8
Monocytes Absolute: 0.6
Monocytes Relative: 9
Neutro Abs: 3.7
Neutrophils Relative %: 58

## 2011-01-27 LAB — CBC
HCT: 40
HCT: 41.6
HCT: 42
Hemoglobin: 14.1
Hemoglobin: 14.4
MCHC: 33.9
MCV: 86.9
MCV: 88.1
Platelets: 254
Platelets: 274
Platelets: 282
RBC: 4.72
RDW: 12.6
RDW: 12.6
RDW: 12.8
WBC: 5.6
WBC: 6.4
WBC: 6.4

## 2011-01-27 LAB — RAPID URINE DRUG SCREEN, HOSP PERFORMED
Amphetamines: NOT DETECTED
Barbiturates: NOT DETECTED
Benzodiazepines: NOT DETECTED
Cocaine: POSITIVE — AB
Opiates: NOT DETECTED
Tetrahydrocannabinol: POSITIVE — AB

## 2011-01-27 LAB — POCT CARDIAC MARKERS
CKMB, poc: 1.5
Myoglobin, poc: 72.9
Operator id: 285841
Troponin i, poc: <0.05

## 2011-01-27 LAB — CK TOTAL AND CKMB (NOT AT ARMC)
Relative Index: 1
Total CK: 311 — ABNORMAL HIGH

## 2011-01-27 LAB — CARDIAC PANEL(CRET KIN+CKTOT+MB+TROPI)
CK, MB: 2
Relative Index: 1
Relative Index: 1
Troponin I: 0.02

## 2011-04-07 MED ORDER — AZITHROMYCIN 250 MG TAB
250 mg | ORAL_TABLET | ORAL | Status: AC
Start: 2011-04-07 — End: 2011-04-12

## 2011-04-07 NOTE — Patient Instructions (Signed)
MyChart Activation    Thank you for requesting access to MyChart. Please follow the instructions below to securely access and download your online medical record. MyChart allows you to send messages to your doctor, view your test results, renew your prescriptions, schedule appointments, and more.    How Do I Sign Up?    1. In your internet browser, go to [email or web address]  2. Click on the First Time User? Click Here link in the Sign In box. You will be redirect to the New Member Sign Up page.  3. Enter your MyChart Access Code exactly as it appears below. You will not need to use this code after you???ve completed the sign-up process. If you do not sign up before the expiration date, you must request a new code.    MyChart Access Code: [email or web address]  Expires: 07/06/2011  9:58 AM (This is the date your MyChart access code will expire)    4. Enter the last four digits of your Social Security Number (xxxx) and Date of Birth (mm/dd/yyyy) as indicated and click Submit. You will be taken to the next sign-up page.  5. Create a MyChart ID. This will be your MyChart login ID and cannot be changed, so think of one that is secure and easy to remember.  6. Create a MyChart password. You can change your password at any time.  7. Enter your Password Reset Question and Answer. This can be used at a later time if you forget your password.   8. Enter your e-mail address. You will receive e-mail notification when new information is available in MyChart.  9. Click Sign Up. You can now view and download portions of your medical record.  10. Click the Download Summary menu link to download a portable copy of your medical information.    Additional Information    If you have questions, please visit the Frequently Asked Questions section of the MyChart website at [email or web address] Remember, MyChart is NOT to be used for urgent needs. For medical emergencies, dial 911.       Claritin one daily as needed for stuffy ears  Zpack   Ibuprofen, liquids, rest, as needed.   Call back as needed.

## 2011-04-07 NOTE — Progress Notes (Signed)
Jason Shah is a 45 y.o. male     SUBJECTIVE:  Left ear ache 6 days ago with chills, myalgias, marked sore throat, cough yellow sputum, and later rt ear pain also   Past Medical History   Diagnosis Date   ??? Eczema 12/30/2010   ??? Depression 12/30/2010   ??? Bronchitis 12/30/2010     Current Outpatient Prescriptions   Medication Sig Dispense Refill   ??? Guaifenesin (MUCINEX) 1,200 mg TM12 Take  by mouth.         ??? ibuprofen (MOTRIN) 200 mg tablet Take  by mouth.         ??? azithromycin (ZITHROMAX) 250 mg tablet Take 2 tablets today, then take 1 tablet daily.  6 Tab  0   ??? naproxen sodium (ALEVE) 220 mg Cap Take  by mouth.           No Known Allergies  .  History   Smoking status   ??? Never Smoker    Smokeless tobacco   ??? Not on file       Review of Systems  Review of Systems - negative except as per HPI      Physical Examination:  BP 90/78   Temp 101.5 ??F (38.6 ??C)   Wt 215 lb (97.523 kg)  Physical Exam    General: Appears in no acute distress  Rt TM ;minimal pink; left TM clear   Small rt ant cerv LN palpable  Throat mild red  Neck supple   Lung-clear  Heart - reg  Abdomen -bs+,soft, nt  Extremities -     . No results found for this or any previous visit.     Assessment/Plan  1. Sinusitis      ERX Zpack . Claritin , Ibuprofen , fluids prn   Follow up  PRN.    Author:  Ysidro Evert, MD 9:50 AM12/17/2012

## 2011-10-20 IMAGING — CR DG CHEST 1V PORT
1 series · 1 of 1 positions shown · non-contrast
Comparison: 11/20/2007

CLINICAL DATA: Chest pain.

PORTABLE CHEST - 1 VIEW

[AP]
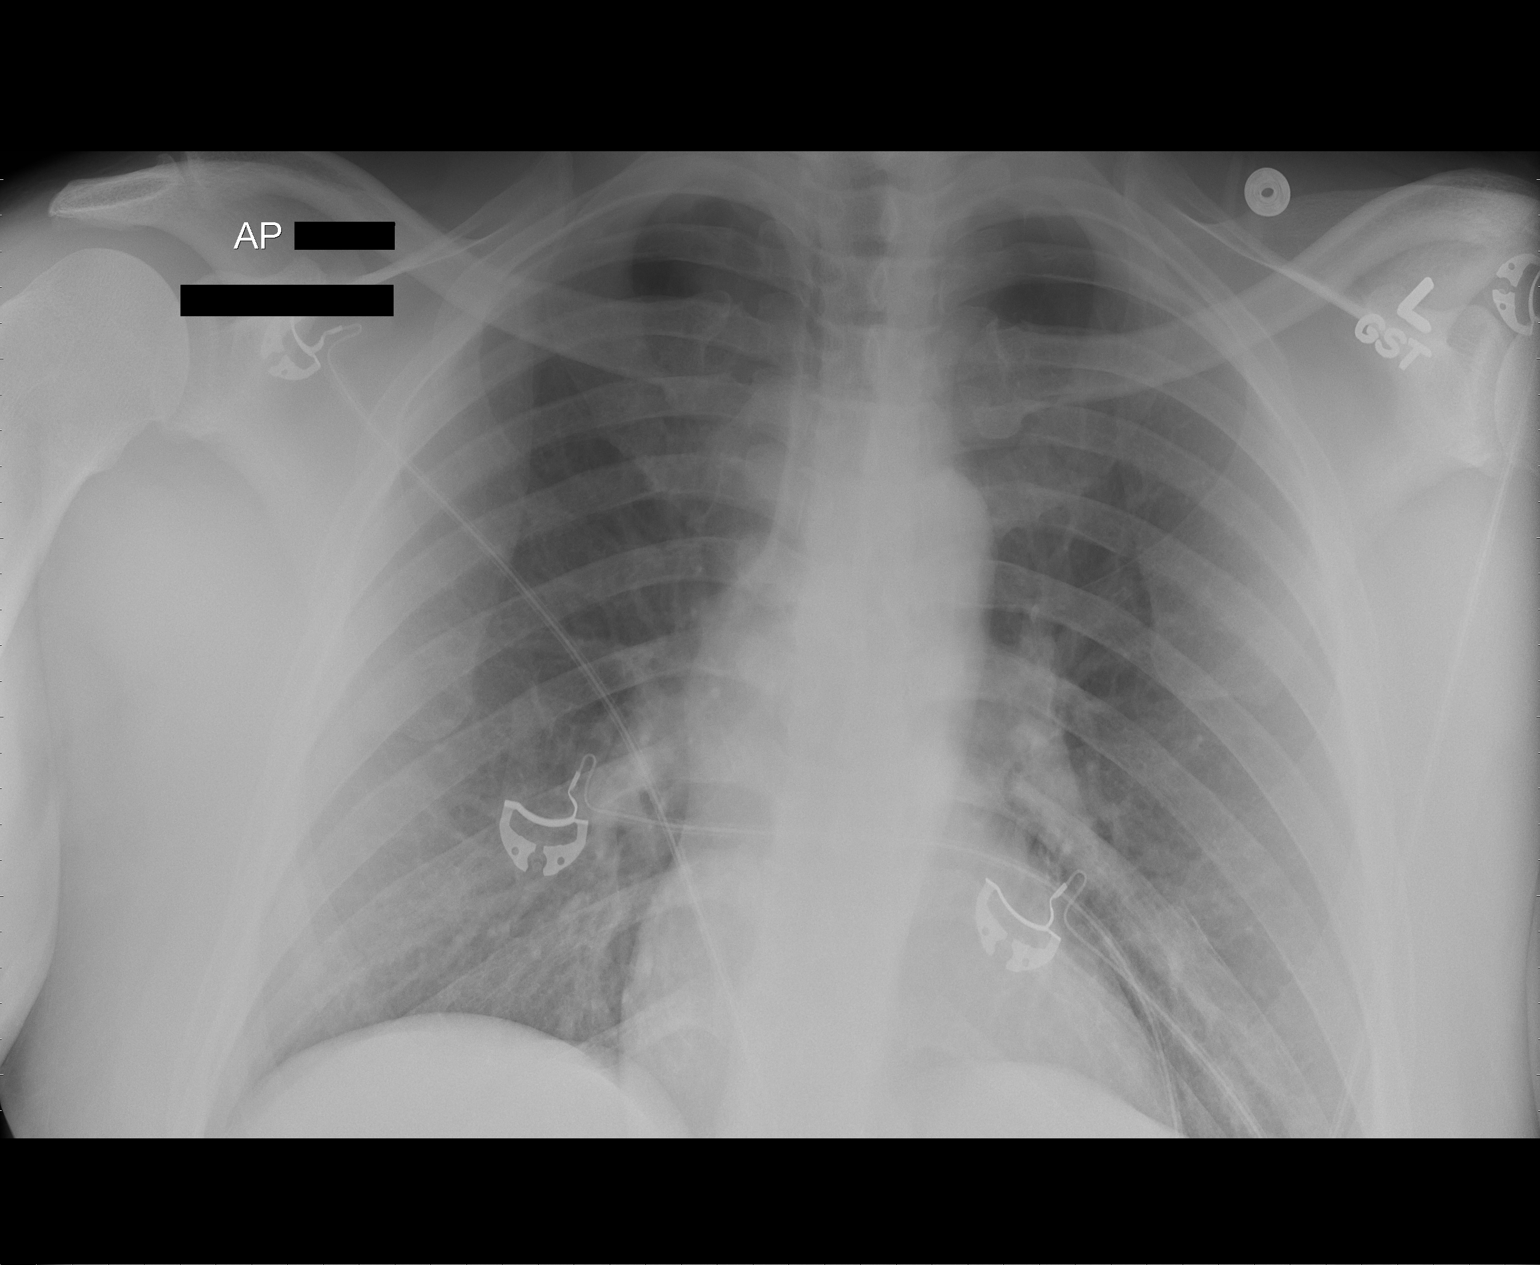

[1 of 1 positions shown; findings below may reference images not displayed]

FINDINGS: The heart size is normal.  The lungs are clear.  The
visualized soft tissues and bony thorax are unremarkable.
IMPRESSION: Negative chest.

## 2011-11-11 NOTE — Telephone Encounter (Signed)
Error

## 2012-05-04 ENCOUNTER — Encounter: Payer: Self-pay | Admitting: Medical

## 2012-05-04 ENCOUNTER — Ambulatory Visit (INDEPENDENT_AMBULATORY_CARE_PROVIDER_SITE_OTHER): Payer: 59 | Admitting: Medical

## 2012-05-04 VITALS — BP 118/80 | HR 76 | Temp 98.4°F | Resp 16 | Ht 69.0 in | Wt 223.0 lb

## 2012-05-04 DIAGNOSIS — R0989 Other specified symptoms and signs involving the circulatory and respiratory systems: Secondary | ICD-10-CM

## 2012-05-04 DIAGNOSIS — J329 Chronic sinusitis, unspecified: Secondary | ICD-10-CM

## 2012-05-04 DIAGNOSIS — R12 Heartburn: Secondary | ICD-10-CM

## 2012-05-04 DIAGNOSIS — K219 Gastro-esophageal reflux disease without esophagitis: Secondary | ICD-10-CM

## 2012-05-04 DIAGNOSIS — E669 Obesity, unspecified: Secondary | ICD-10-CM

## 2012-05-04 DIAGNOSIS — G4710 Hypersomnia, unspecified: Secondary | ICD-10-CM

## 2012-05-04 DIAGNOSIS — R0681 Apnea, not elsewhere classified: Secondary | ICD-10-CM

## 2012-05-04 DIAGNOSIS — R400 Somnolence: Secondary | ICD-10-CM

## 2012-05-04 DIAGNOSIS — R0683 Snoring: Secondary | ICD-10-CM

## 2012-05-04 DIAGNOSIS — G478 Other sleep disorders: Secondary | ICD-10-CM

## 2012-05-04 MED ORDER — ESOMEPRAZOLE MAGNESIUM 40 MG PO CPDR: 40.0000 mg | DELAYED_RELEASE_CAPSULE | Freq: Every day | ORAL | Status: DC

## 2012-05-04 MED ORDER — AMOXICILLIN 875 MG PO TABS: 875.0000 mg | ORAL_TABLET | Freq: Two times a day (BID) | ORAL | Status: DC

## 2012-05-04 NOTE — Progress Notes (Signed)
Subjective:   HPI  Thomas Harvey is a 47 y.o. male who presents as a new patient.  Here with his wife.  No prior primary care.   He reports 2-3 year hx/o GERD.  Gets heartburn, sore throat, belches a lot, this occurs all the time.  Wife says he eats tums and drinks pepto.  He has used and failed zantac and prilosec OTC.  He is a nonsmoker.  No family hx/o GI cancer.   2 wk hx/o sinus congestion, thick mucous from nose, head pressure, sore throat, nasal drainage down back of throat, won't resolve.    Wife notes long hx/o snoring, stops breathing in his sleep, wakes him self up from not breathing.  He sometimes feels rested in the mornings.  Takes naps during the days.  He is a Naval architect.  Not exercising.     No other c/o.  The following portions of the patient's history were reviewed and updated as appropriate: allergies, current medications, past family history, past medical history, past social history, past surgical history and problem list.  No Known Allergies  Current Outpatient Prescriptions on File Prior to Visit  Medication Sig Dispense Refill  . esomeprazole (NEXIUM) 40 MG capsule Take 1 capsule (40 mg total) by mouth daily.  30 capsule  3    Past Medical History  Diagnosis Date  . Allergy   . Wears glasses   . GERD (gastroesophageal reflux disease)     No past surgical history on file.  Family History  Problem Relation Age of Onset  . Diabetes Mother   . GER disease Mother   . Hypertension Mother   . Gout Father   . Asthma Brother   . Cancer Neg Hx     History   Social History  . Marital Status: Single    Spouse Name: N/A    Number of Children: N/A  . Years of Education: N/A   Occupational History  . Not on file.   Social History Main Topics  . Smoking status: Never Smoker   . Smokeless tobacco: Not on file  . Alcohol Use: Not on file  . Drug Use: No  . Sexually Active: Not on file   Other Topics Concern  . Not on file   Social History  Narrative   Married, truck driver, no exercise   Review of Systems ROS reviewed and was negative other than noted in HPI or above.    Objective:   Physical Exam  General appearance: alert, no distress, WD/WN, AA male HEENT: normocephalic, sclerae anicteric, TMs with serous fluid behind TMs, nares with swollen turbinates, purulent discharge, pharynx normal Oral cavity: MMM, no lesions Neck: supple, no lymphadenopathy, no thyromegaly, no masses Heart: RRR, normal S1, S2, no murmurs Lungs: CTA bilaterally, no wheezes, rhonchi, or rales Abdomen: +bs, soft, non tender, non distended, no masses, no hepatomegaly, no splenomegaly Pulses: 2+ symmetric, upper and lower extremities, normal cap refill   Assessment and Plan :     Encounter Diagnoses  Name Primary?  . GERD (gastroesophageal reflux disease) Yes  . Heartburn   . Sinusitis   . Non-restorative sleep   . Daytime somnolence   . Witnessed apneic spells   . Snoring   . Obesity     GERD/heartburn - begin trial of Nexium.  Samples given.  Advised to avoid GERD food triggers. He has failed OTC Zantac, Tums, Prilosec.  If not improving in 3-4 wk, will likely need GI referral.  Sinusitis -  begin Amoxicillin, rest, hydrate well, call/return if not improving in 1wk.  Non-restorative sleep, daytime somnolence, witness apnea, snoring - will set up for in home sleep study.  Advised weight loss.  Obesity - need to work on diet and exercise changes to lose weight  F/u 48mo for physical

## 2012-05-04 NOTE — Patient Instructions (Signed)
Sinus infection - begin Amoxicillin antibiotic, 1 tablet twice daily for 10 days.   complete the whole course of antibiotic.  Increase your water intake. Consider Mucinex OTC to help with the sinuses.  Recheck if not improving.  We are going to set you up for an in home sleep study for suspected sleep apnea.    Acid reflux/GERD. Begin Nexium, 1 capsule 45-60 minutes before breakfast.  Recheck in 1 month for a physical.   Gastroesophageal Reflux Disease, Adult Gastroesophageal reflux disease (GERD) happens when acid from your stomach flows up into the esophagus. When acid comes in contact with the esophagus, the acid causes soreness (inflammation) in the esophagus. Over time, GERD may create small holes (ulcers) in the lining of the esophagus. CAUSES   Increased body weight. This puts pressure on the stomach, making acid rise from the stomach into the esophagus.  Smoking. This increases acid production in the stomach.  Drinking alcohol. This causes decreased pressure in the lower esophageal sphincter (valve or ring of muscle between the esophagus and stomach), allowing acid from the stomach into the esophagus.  Late evening meals and a full stomach. This increases pressure and acid production in the stomach.  A malformed lower esophageal sphincter. Sometimes, no cause is found. SYMPTOMS   Burning pain in the lower part of the mid-chest behind the breastbone and in the mid-stomach area. This may occur twice a week or more often.  Trouble swallowing.  Sore throat.  Dry cough.  Asthma-like symptoms including chest tightness, shortness of breath, or wheezing. DIAGNOSIS  Your caregiver may be able to diagnose GERD based on your symptoms. In some cases, X-rays and other tests may be done to check for complications or to check the condition of your stomach and esophagus. TREATMENT  Your caregiver may recommend over-the-counter or prescription medicines to help decrease acid production. Ask  your caregiver before starting or adding any new medicines.  HOME CARE INSTRUCTIONS   Change the factors that you can control. Ask your caregiver for guidance concerning weight loss, quitting smoking, and alcohol consumption.  Avoid foods and drinks that make your symptoms worse, such as:  Caffeine or alcoholic drinks.  Chocolate.  Peppermint or mint flavorings.  Garlic and onions.  Spicy foods.  Citrus fruits, such as oranges, lemons, or limes.  Tomato-based foods such as sauce, chili, salsa, and pizza.  Fried and fatty foods.  Avoid lying down for the 3 hours prior to your bedtime or prior to taking a nap.  Eat small, frequent meals instead of large meals.  Wear loose-fitting clothing. Do not wear anything tight around your waist that causes pressure on your stomach.  Raise the head of your bed 6 to 8 inches with wood blocks to help you sleep. Extra pillows will not help.  Only take over-the-counter or prescription medicines for pain, discomfort, or fever as directed by your caregiver.  Do not take aspirin, ibuprofen, or other nonsteroidal anti-inflammatory drugs (NSAIDs). SEEK IMMEDIATE MEDICAL CARE IF:   You have pain in your arms, neck, jaw, teeth, or back.  Your pain increases or changes in intensity or duration.  You develop nausea, vomiting, or sweating (diaphoresis).  You develop shortness of breath, or you faint.  Your vomit is green, yellow, black, or looks like coffee grounds or blood.  Your stool is red, bloody, or black. These symptoms could be signs of other problems, such as heart disease, gastric bleeding, or esophageal bleeding. MAKE SURE YOU:   Understand these instructions.  Will watch your condition.  Will get help right away if you are not doing well or get worse. Document Released: 01/15/2005 Document Revised: 06/30/2011 Document Reviewed: 10/25/2010 [facility] Patient Information 2013 [place], [place].

## 2012-05-06 ENCOUNTER — Telehealth: Payer: Self-pay | Admitting: Medical

## 2012-05-11 ENCOUNTER — Other Ambulatory Visit: Payer: Self-pay | Admitting: Medical

## 2012-05-11 ENCOUNTER — Telehealth: Payer: Self-pay | Admitting: Medical

## 2012-05-11 MED ORDER — DEXLANSOPRAZOLE 60 MG PO CPDR: 60.0000 mg | DELAYED_RELEASE_CAPSULE | Freq: Every day | ORAL | Status: DC

## 2012-05-11 NOTE — Telephone Encounter (Signed)
Lets switch to Dexilant.   Script sent.  We probably have samples too.

## 2012-05-11 NOTE — Telephone Encounter (Signed)
DO YOU WANT TO SWITCH TO DEXILANT OR PANTOPRAZOLE SODIUM

## 2012-05-12 ENCOUNTER — Telehealth: Payer: Self-pay | Admitting: Medical

## 2012-05-18 NOTE — Telephone Encounter (Signed)
LM

## 2012-05-21 ENCOUNTER — Encounter: Payer: Self-pay | Admitting: Internal Medicine

## 2012-05-24 ENCOUNTER — Telehealth: Payer: Self-pay | Admitting: Medical

## 2012-05-24 NOTE — Telephone Encounter (Signed)
i thought I sent Nexium to pharmacy?  Was the Dexilant or Nexium not approved?  We can provide samples in the meantime of either one.

## 2012-05-25 NOTE — Telephone Encounter (Signed)
Patient states that he can not remember which one of them was not improved through his insurance so he has decided not mess with those expensive medications. He said that his wife bought some OTC medication that is working well for him so he will just use that. CLS

## 2012-06-08 ENCOUNTER — Ambulatory Visit (INDEPENDENT_AMBULATORY_CARE_PROVIDER_SITE_OTHER): Payer: 59 | Admitting: Medical

## 2012-06-08 ENCOUNTER — Encounter: Payer: Self-pay | Admitting: Medical

## 2012-06-08 VITALS — BP 130/80 | HR 60 | Temp 98.5°F | Resp 16 | Ht 69.0 in | Wt 224.0 lb

## 2012-06-08 DIAGNOSIS — R0609 Other forms of dyspnea: Secondary | ICD-10-CM

## 2012-06-08 DIAGNOSIS — R0681 Apnea, not elsewhere classified: Secondary | ICD-10-CM

## 2012-06-08 DIAGNOSIS — R0989 Other specified symptoms and signs involving the circulatory and respiratory systems: Secondary | ICD-10-CM

## 2012-06-08 DIAGNOSIS — R0602 Shortness of breath: Secondary | ICD-10-CM

## 2012-06-08 DIAGNOSIS — K219 Gastro-esophageal reflux disease without esophagitis: Secondary | ICD-10-CM | POA: Insufficient documentation

## 2012-06-08 DIAGNOSIS — R3129 Other microscopic hematuria: Secondary | ICD-10-CM

## 2012-06-08 DIAGNOSIS — Z23 Encounter for immunization: Secondary | ICD-10-CM

## 2012-06-08 DIAGNOSIS — Z0000 Encounter for general adult medical examination without abnormal findings: Secondary | ICD-10-CM

## 2012-06-08 DIAGNOSIS — G4710 Hypersomnia, unspecified: Secondary | ICD-10-CM

## 2012-06-08 DIAGNOSIS — E669 Obesity, unspecified: Secondary | ICD-10-CM

## 2012-06-08 DIAGNOSIS — Q849 Congenital malformation of integument, unspecified: Secondary | ICD-10-CM

## 2012-06-08 DIAGNOSIS — G478 Other sleep disorders: Secondary | ICD-10-CM

## 2012-06-08 DIAGNOSIS — Q829 Congenital malformation of skin, unspecified: Secondary | ICD-10-CM

## 2012-06-08 LAB — POCT URINALYSIS DIPSTICK
Ketones, UA: NEGATIVE
Leukocytes, UA: NEGATIVE
Nitrite, UA: NEGATIVE
Protein, UA: NEGATIVE
Urobilinogen, UA: NEGATIVE
pH, UA: 5

## 2012-06-08 LAB — CBC WITH DIFFERENTIAL/PLATELET
Basophils Absolute: 0 10*3/uL (ref 0.0–0.1)
Eosinophils Absolute: 0.3 10*3/uL (ref 0.0–0.7)
Eosinophils Relative: 6 % — ABNORMAL HIGH (ref 0–5)
HCT: 43.9 % (ref 39.0–52.0)
Lymphocytes Relative: 35 % (ref 12–46)
MCH: 29.2 pg (ref 26.0–34.0)
MCHC: 33.9 g/dL (ref 30.0–36.0)
MCV: 86.1 fL (ref 78.0–100.0)
Monocytes Absolute: 0.5 10*3/uL (ref 0.1–1.0)
Platelets: 285 10*3/uL (ref 150–400)
RDW: 13.8 % (ref 11.5–15.5)
WBC: 5.4 10*3/uL (ref 4.0–10.5)

## 2012-06-08 MED ORDER — DEXLANSOPRAZOLE 60 MG PO CPDR: 60.0000 mg | DELAYED_RELEASE_CAPSULE | Freq: Every day | ORAL | Status: DC

## 2012-06-08 MED ORDER — TRIAMCINOLONE ACETONIDE 0.1 % EX CREA: TOPICAL_CREAM | Freq: Two times a day (BID) | CUTANEOUS | Status: DC

## 2012-06-08 NOTE — Patient Instructions (Addendum)
Gastroesophageal Reflux Disease, Adult Gastroesophageal reflux disease (GERD) happens when acid from your stomach flows up into the esophagus. When acid comes in contact with the esophagus, the acid causes soreness (inflammation) in the esophagus. Over time, GERD may create small holes (ulcers) in the lining of the esophagus. CAUSES   Increased body weight. This puts pressure on the stomach, making acid rise from the stomach into the esophagus.   Smoking. This increases acid production in the stomach.   Drinking alcohol. This causes decreased pressure in the lower esophageal sphincter (valve or ring of muscle between the esophagus and stomach), allowing acid from the stomach into the esophagus.   Late evening meals and a full stomach. This increases pressure and acid production in the stomach.   A malformed lower esophageal sphincter.  Sometimes, no cause is found. SYMPTOMS   Burning pain in the lower part of the mid-chest behind the breastbone and in the mid-stomach area. This may occur twice a week or more often.   Trouble swallowing.   Sore throat.   Dry cough.   Asthma-like symptoms including chest tightness, shortness of breath, or wheezing.  DIAGNOSIS  Your caregiver may be able to diagnose GERD based on your symptoms. In some cases, X-rays and other tests may be done to check for complications or to check the condition of your stomach and esophagus. TREATMENT  Your caregiver may recommend over-the-counter or prescription medicines to help decrease acid production. Ask your caregiver before starting or adding any new medicines.  HOME CARE INSTRUCTIONS   Change the factors that you can control. Ask your caregiver for guidance concerning weight loss, quitting smoking, and alcohol consumption.   Avoid foods and drinks that make your symptoms worse, such as:   Caffeine or alcoholic drinks.   Chocolate.   Peppermint or mint flavorings.   Garlic and onions.   Spicy foods.     Citrus fruits, such as oranges, lemons, or limes.   Tomato-based foods such as sauce, chili, salsa, and pizza.   Fried and fatty foods.   Avoid lying down for the 3 hours prior to your bedtime or prior to taking a nap.   Eat small, frequent meals instead of large meals.   Wear loose-fitting clothing. Do not wear anything tight around your waist that causes pressure on your stomach.   Raise the head of your bed 6 to 8 inches with wood blocks to help you sleep. Extra pillows will not help.   Only take over-the-counter or prescription medicines for pain, discomfort, or fever as directed by your caregiver.   Do not take aspirin, ibuprofen, or other nonsteroidal anti-inflammatory drugs (NSAIDs).  SEEK IMMEDIATE MEDICAL CARE IF:   You have pain in your arms, neck, jaw, teeth, or back.   Your pain increases or changes in intensity or duration.   You develop nausea, vomiting, or sweating (diaphoresis).   You develop shortness of breath, or you faint.   Your vomit is green, yellow, black, or looks like coffee grounds or blood.   Your stool is red, bloody, or black.  These symptoms could be signs of other problems, such as heart disease, gastric bleeding, or esophageal bleeding. MAKE SURE YOU:   Understand these instructions.   Will watch your condition.   Will get help right away if you are not doing well or get worse.  Document Released: 01/15/2005 Document Revised: 12/18/2010 Document Reviewed: 10/25/2010 [facility] Patient Information 2012 [place], [place].    Blood in urine: You had a  trace amount of microscopic blood in the urine.   Please recheck here with urine specimen in 2 weeks.    Sleep apnea - we are setting you up a home sleep study.   If you haven't heard from the sleep study people in 3-4 days, then call back.    Take a 81mg  Aspirin daily at bedtime for heart health.
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
## 2012-06-09 LAB — LIPID PANEL
HDL: 35 mg/dL — ABNORMAL LOW (ref >39)
LDL Cholesterol: 74 mg/dL (ref 0–99)

## 2012-06-09 LAB — COMPREHENSIVE METABOLIC PANEL
ALT: 18 U/L (ref 0–53)
AST: 17 U/L (ref 0–37)
Alkaline Phosphatase: 70 U/L (ref 39–117)
Creat: 1.31 mg/dL (ref 0.50–1.35)
Sodium: 138 mEq/L (ref 135–145)
Total Bilirubin: 0.4 mg/dL (ref 0.3–1.2)
Total Protein: 7.6 g/dL (ref 6.0–8.3)

## 2012-07-06 ENCOUNTER — Ambulatory Visit: Payer: 59 | Admitting: Medical

## 2012-12-05 ENCOUNTER — Emergency Department (HOSPITAL_COMMUNITY): Payer: 59

## 2012-12-05 ENCOUNTER — Encounter (HOSPITAL_COMMUNITY): Payer: Self-pay | Admitting: Emergency Medicine

## 2012-12-05 DIAGNOSIS — M25569 Pain in unspecified knee: Secondary | ICD-10-CM | POA: Insufficient documentation

## 2012-12-05 DIAGNOSIS — M25469 Effusion, unspecified knee: Secondary | ICD-10-CM | POA: Insufficient documentation

## 2012-12-05 DIAGNOSIS — Z8719 Personal history of other diseases of the digestive system: Secondary | ICD-10-CM | POA: Insufficient documentation

## 2012-12-05 NOTE — ED Notes (Signed)
PT. REPORTS RIGHT KNEE PAIN / SWELLING ONSET Friday , DENIES INJURY OR FALL .

## 2012-12-06 ENCOUNTER — Other Ambulatory Visit (HOSPITAL_COMMUNITY): Payer: Self-pay | Admitting: Emergency Medicine

## 2012-12-06 ENCOUNTER — Ambulatory Visit (HOSPITAL_COMMUNITY)
Admission: RE | Admit: 2012-12-06 | Discharge: 2012-12-06 | Disposition: A | Discharge status: Home / Self Care | Payer: 59 | Source: Ambulatory Visit | Attending: Emergency Medicine | Admitting: Emergency Medicine

## 2012-12-06 ENCOUNTER — Emergency Department (HOSPITAL_COMMUNITY)
Admission: EM | Admit: 2012-12-06 | Discharge: 2012-12-06 | Disposition: A | Discharge status: Home / Self Care | Payer: 59 | Attending: Emergency Medicine | Admitting: Emergency Medicine

## 2012-12-06 DIAGNOSIS — M25561 Pain in right knee: Secondary | ICD-10-CM

## 2012-12-06 DIAGNOSIS — M25461 Effusion, right knee: Secondary | ICD-10-CM

## 2012-12-06 MED ORDER — KETOROLAC TROMETHAMINE 30 MG/ML IJ SOLN
30.0000 mg | Freq: Once | INTRAMUSCULAR | Status: AC
  Administered 2012-12-06: 30 mg via INTRAMUSCULAR
  Filled 2012-12-06: qty 1

## 2012-12-06 MED ORDER — OXYCODONE-ACETAMINOPHEN 5-325 MG PO TABS
2.0000 | ORAL_TABLET | Freq: Once | ORAL | Status: AC
  Administered 2012-12-06: 2 via ORAL
  Filled 2012-12-06: qty 2

## 2012-12-06 MED ORDER — HYDROCODONE-ACETAMINOPHEN 5-325 MG PO TABS: 1.0000 | ORAL_TABLET | ORAL | Status: DC | PRN

## 2012-12-06 MED ORDER — NAPROXEN 500 MG PO TABS: 500.0000 mg | ORAL_TABLET | Freq: Two times a day (BID) | ORAL | Status: DC

## 2012-12-06 NOTE — ED Provider Notes (Signed)
CSN: [id number]     Arrival date & time 12/05/12  2306 History     First MD Initiated Contact with Patient 12/06/12 0134     Chief Complaint  Patient presents with  . Knee Pain   HPI  History provided by the patient. The patient is a 47 year old male who presents with complaints of right knee pain and swelling. Symptoms first began Friday and did occur after a long day of increased work at the house. Patient did report being on his knees part of the time. There was no specific injury or trauma. He has been using his knee brace and ice with elevation but has not had any significant improvement. He is also use ibuprofen for pain with slight relief. He denies any other associated symptoms. No weakness or numbness in the foot or leg. No fever, chills or sweats. He does report a past history of some issues of pain and swelling to the knee in the past. No other aggravating or alleviating factors. No other associated symptoms.    Past Medical History  Diagnosis Date  . Allergy   . Wears glasses   . GERD (gastroesophageal reflux disease)   . H/O echocardiogram 03/25/07    normal LV function and size, EF 65%   History reviewed. No pertinent past surgical history. Family History  Problem Relation Age of Onset  . Diabetes Mother   . GER disease Mother   . Hypertension Mother   . Gout Father   . Asthma Brother   . Heart disease Neg Hx   . Stroke Neg Hx   . Cancer Maternal Uncle    History  Substance Use Topics  . Smoking status: Never Smoker   . Smokeless tobacco: Not on file  . Alcohol Use: 1.8 oz/week    3 Cans of beer per week    Review of Systems  Neurological: Negative for weakness and numbness.  All other systems reviewed and are negative.    Allergies  Review of patient's allergies indicates no known allergies.  Home Medications   Current Outpatient Rx  Name  Route  Sig  Dispense  Refill  . ibuprofen (ADVIL,MOTRIN) 200 MG tablet   Oral   Take 400 mg by mouth every  6 (six) hours as needed for pain.         [name] HYDROcodone-acetaminophen (NORCO) 5-325 MG per tablet   Oral   Take 1 tablet by mouth every 4 (four) hours as needed for pain.   20 tablet   0   . naproxen (NAPROSYN) 500 MG tablet   Oral   Take 1 tablet (500 mg total) by mouth 2 (two) times daily.   30 tablet   0    BP 142/83  Pulse 77  Temp(Src) 98.4 F (36.9 C) (Oral)  Resp 18  SpO2 97% Physical Exam  Nursing note and vitals reviewed. Constitutional: He is oriented to person, place, and time. He appears well-developed and well-nourished. No distress.  HENT:  Head: Normocephalic.  Cardiovascular: Normal rate and regular rhythm.   Pulmonary/Chest: Effort normal and breath sounds normal. No respiratory distress. He has no wheezes. He has no rales.  Musculoskeletal: He exhibits edema and tenderness.  Moderate swelling of the right knee with positive ballottement. There is no increased laxity with this rare stress. No obvious deformities of the knee. Pain with range of motion otherwise full. Normal distal sensations and pulses and foot.  Neurological: He is alert and oriented to person, place, and time.  Skin: Skin is warm. No erythema.  Psychiatric: He has a normal mood and affect. His behavior is normal.    ED Course   Procedures   No results found.   1. Knee joint effusion, right   2. Knee pain, acute, right     MDM  Patient seen and evaluated. Patient appears well but does appear in some discomfort. No acute distress.  X-rays reviewed and discussed with radiologist. There appears to be old well-corticated fractures to the patella. No acute fractures. There is no joint effusion. No other concerning findings.  Patient already has a knee brace and crutches. He will continue to use this at home. I will provide orthopedic referral.   Angus Seller, PA-C 12/06/12 1610

## 2012-12-06 NOTE — ED Notes (Addendum)
C/o pain in R knee (above, below and lateral to R patella), onset 2d ago, gradually progressively worse, minimal swelling noted, CMS intact, pedal pulses palpable, skin temp of knee equal to L (not hot), decreased ROM d/t pain, pain worse with ambulation and movement, no known injury. Took ibuprofen 400mg  at 1900. Denies h/o gout or joint aspiration, family h/o gout.

## 2012-12-06 NOTE — ED Provider Notes (Signed)
Medical screening examination/treatment/procedure(s) were performed by non-physician practitioner and as supervising physician I was immediately available for consultation/collaboration.  Olivia Mackie, MD 12/06/12 [phone]

## 2012-12-06 NOTE — ED Notes (Signed)
Pt alert, NAD, calm, interactive, skin W&D, resps e/u, speaking in clear complete sentences, wife at side, pt sitting in w/c, pending xray results.

## 2012-12-28 ENCOUNTER — Ambulatory Visit (INDEPENDENT_AMBULATORY_CARE_PROVIDER_SITE_OTHER): Payer: 59 | Admitting: Medical

## 2012-12-28 ENCOUNTER — Encounter: Payer: Self-pay | Admitting: Medical

## 2012-12-28 VITALS — BP 140/100 | HR 76 | Temp 97.9°F | Resp 16 | Wt 218.0 lb

## 2012-12-28 DIAGNOSIS — M25561 Pain in right knee: Secondary | ICD-10-CM

## 2012-12-28 DIAGNOSIS — B351 Tinea unguium: Secondary | ICD-10-CM

## 2012-12-28 DIAGNOSIS — D239 Other benign neoplasm of skin, unspecified: Secondary | ICD-10-CM

## 2012-12-28 DIAGNOSIS — M25569 Pain in unspecified knee: Secondary | ICD-10-CM

## 2012-12-28 DIAGNOSIS — L03019 Cellulitis of unspecified finger: Secondary | ICD-10-CM

## 2012-12-28 DIAGNOSIS — L03012 Cellulitis of left finger: Secondary | ICD-10-CM

## 2012-12-28 DIAGNOSIS — D229 Melanocytic nevi, unspecified: Secondary | ICD-10-CM

## 2012-12-28 MED ORDER — HYDROCODONE-ACETAMINOPHEN 5-325 MG PO TABS: 1.0000 | ORAL_TABLET | ORAL | Status: DC | PRN

## 2012-12-28 MED ORDER — CEPHALEXIN 500 MG PO CAPS: 500.0000 mg | ORAL_CAPSULE | Freq: Three times a day (TID) | ORAL | Status: DC

## 2012-12-28 MED ORDER — NAPROXEN 500 MG PO TABS: 500.0000 mg | ORAL_TABLET | Freq: Two times a day (BID) | ORAL | Status: DC
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
## 2012-12-28 NOTE — Addendum Note (Signed)
Addended by: Jac Canavan on: 12/28/2012 03:17 PM   Modules accepted: Level of Service

## 2012-12-28 NOTE — Patient Instructions (Signed)
Paronychia/nailbed infection:  Begin Keflex antibiotic twice daily for 7-10 days  Begin warm moist compresses to the thumb  After this seems to help calm down the swelling, we will need to consider beginning Lamisil tablets for nail fungus.  But we will defer this until after the current medications are used.  Knee pain -   This seems to be inflammatory.  For the next 7-10 days, rest the knee when possible  Don't do any other activity other than work  Be cautious with the knee at work  Begin neoprene sleeve for knee compression  Begin back on Naprosyn twice daily for a week, then as needed  Use the Hydrocodone pain pill as needed at night time  If this doesn't improve, of if worsening, then let me know

## 2013-01-03 ENCOUNTER — Telehealth: Payer: Self-pay | Admitting: Family Medicine

## 2013-01-03 ENCOUNTER — Encounter: Payer: Self-pay | Admitting: Medical

## 2013-01-03 NOTE — Telephone Encounter (Signed)
I left a message on the patients voicemail about his results. CLS

## 2013-01-03 NOTE — Telephone Encounter (Signed)
Message copied by Janeice Robinson on Mon Jan 03, 2013  5:03 PM ------      Message from: [place], [place]      Created: Fri Dec 31, 2012  5:29 PM       Lesion is a skin tag as suspected, benign ------

## 2013-01-23 ENCOUNTER — Emergency Department (HOSPITAL_COMMUNITY): Payer: 59

## 2013-01-23 ENCOUNTER — Encounter (HOSPITAL_COMMUNITY): Payer: Self-pay | Admitting: *Deleted

## 2013-01-23 ENCOUNTER — Emergency Department (HOSPITAL_COMMUNITY)
Admission: EM | Admit: 2013-01-23 | Discharge: 2013-01-23 | Disposition: A | Discharge status: Home / Self Care | Payer: 59 | Attending: Emergency Medicine | Admitting: Emergency Medicine

## 2013-01-23 DIAGNOSIS — M25461 Effusion, right knee: Secondary | ICD-10-CM

## 2013-01-23 DIAGNOSIS — Z8719 Personal history of other diseases of the digestive system: Secondary | ICD-10-CM | POA: Insufficient documentation

## 2013-01-23 DIAGNOSIS — Z791 Long term (current) use of non-steroidal anti-inflammatories (NSAID): Secondary | ICD-10-CM | POA: Insufficient documentation

## 2013-01-23 DIAGNOSIS — M25469 Effusion, unspecified knee: Secondary | ICD-10-CM | POA: Insufficient documentation

## 2013-01-23 DIAGNOSIS — Z9189 Other specified personal risk factors, not elsewhere classified: Secondary | ICD-10-CM | POA: Insufficient documentation

## 2013-01-23 MED ORDER — IBUPROFEN 600 MG PO TABS: 600.0000 mg | ORAL_TABLET | Freq: Four times a day (QID) | ORAL | Status: DC | PRN

## 2013-01-23 MED ORDER — HYDROCODONE-ACETAMINOPHEN 5-325 MG PO TABS: 1.0000 | ORAL_TABLET | ORAL | Status: DC | PRN

## 2013-01-23 MED ORDER — KETOROLAC TROMETHAMINE 60 MG/2ML IM SOLN
60.0000 mg | Freq: Once | INTRAMUSCULAR | Status: AC
  Administered 2013-01-23: 60 mg via INTRAMUSCULAR
  Filled 2013-01-23: qty 2

## 2013-01-23 NOTE — ED Notes (Addendum)
Chronic R knee pain, (denies: fever or sx other than pain and swelling), h/o effusion, seen here for same in the past. Pinpoints pain to ~3" above knee and ~2" below knee, primarily anterior knee (cap), "sx resolved and improved after last [facility] ED visit, but then got worse Thursday", has not seen referred specialist, took prescribed pain med w/o lasting improvement, denies h/o arthrocentesis. Ambulatory into triage with limp, alert, NAD, calm, interactive.

## 2013-01-23 NOTE — ED Notes (Signed)
Patient transported to X-ray 

## 2013-01-23 NOTE — ED Notes (Signed)
Ortho tech called for knee immobilizer.  En route to the ED.

## 2013-01-23 NOTE — ED Provider Notes (Signed)
CSN: [id number]     Arrival date & time 01/23/13  4696 History   First MD Initiated Contact with Patient 01/23/13 [phone]     Chief Complaint  Patient presents with  . Knee Pain   (Consider location/radiation/quality/duration/timing/severity/associated sxs/prior Treatment) HPI Patient presents with right knee pain starting 4 days ago. He's had similar pain in the past. He states the orthopedist. States he developed pain while working. He continued to work for the next few days. He's noticed swelling around the knee. There's been no redness or warmth. He denies any fevers or chills. Patient does state that the knee does seem to give out on him especially when pivoting. Denies any weakness or numbness. No definite trauma to the knee Past Medical History  Diagnosis Date  . Allergy   . Wears glasses   . GERD (gastroesophageal reflux disease)   . H/O echocardiogram 03/25/07    normal LV function and size, EF 65%   History reviewed. No pertinent past surgical history. Family History  Problem Relation Age of Onset  . Diabetes Mother   . GER disease Mother   . Hypertension Mother   . Gout Father   . Asthma Brother   . Heart disease Neg Hx   . Stroke Neg Hx   . Cancer Maternal Uncle    History  Substance Use Topics  . Smoking status: Never Smoker   . Smokeless tobacco: Not on file  . Alcohol Use: 1.8 oz/week    3 Cans of beer per week    Review of Systems  Constitutional: Negative for fever and chills.  Musculoskeletal: Positive for arthralgias.  Skin: Negative for color change and wound.  Neurological: Negative for weakness and numbness.  All other systems reviewed and are negative.    Allergies  Review of patient's allergies indicates no known allergies.  Home Medications   Current Outpatient Rx  Name  Route  Sig  Dispense  Refill  . HYDROcodone-acetaminophen (NORCO) 5-325 MG per tablet   Oral   Take 1 tablet by mouth every 4 (four) hours as needed for pain.   20  tablet   0   . ibuprofen (ADVIL,MOTRIN) 200 MG tablet   Oral   Take 400 mg by mouth every 6 (six) hours as needed for pain.         . naproxen (NAPROSYN) 500 MG tablet   Oral   Take 1 tablet (500 mg total) by mouth 2 (two) times daily.   30 tablet   0   . OVER THE COUNTER MEDICATION   Oral   Take 1 tablet by mouth daily. OTC heartburn med          BP 155/84  Pulse 88  Temp(Src) 97.8 F (36.6 C) (Oral)  Resp 18  SpO2 97% Physical Exam  Nursing note and vitals reviewed. Constitutional: He is oriented to person, place, and time. He appears well-developed and well-nourished. No distress.  HENT:  Head: Normocephalic and atraumatic.  Neck: Normal range of motion. Neck supple.  Cardiovascular: Normal rate and regular rhythm.   Pulmonary/Chest: Effort normal.  Musculoskeletal: He exhibits tenderness. He exhibits no edema.  Patient with suprapatellar effusion to the right knee. He has tenderness to palpation over his right patella and lateral knee. There is no ligamentous instability. There is no erythema or warmth. Has mildly limited range of motion due to discomfort. Dorsalis pedis pulses intact distally. No pain to the posterior knee or calf area.  Neurological: He is alert and  oriented to person, place, and time.  5/5 motor in all extremities. Sensation fully intact  Skin: Skin is warm and dry. No rash noted. No erythema.  Psychiatric: He has a normal mood and affect. His behavior is normal.    ED Course  Procedures (including critical care time) Labs Review Labs Reviewed - No data to display Imaging Review No results found.  MDM  Patient likely has knee pain effusion related to arthritis versus meniscal injury. We'll screen with x-ray and likely place in knee immobilizer. Patient has been advised that he will need to follow with orthopedics.   Loren Racer, MD 01/23/13 [phone]

## 2013-08-01 NOTE — Progress Notes (Signed)
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   Periodic left chest  Pain  With recent eval'n by  Dr. Fransisca Kaufmann  Old left knee sprain and periodic pain , but denies other joint pain or sig am stiffness.   Feels generally good. Stopped referring lacrosse. deneis dizzy    Tingling x 8-9 m in ulnar hand surface and 45 and ulnar aspect of 3rd finger.    Left elbow pain  After heavy  Lifting few years ago .   Past Medical History   Diagnosis Date   ??? Eczema 12/30/2010   ??? Depression 12/30/2010   ??? Bronchitis 12/30/2010   ??? 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 In [place]. Cath:   LAD obstruction   ; tx: 2 stents. Also has obstruction  in  2nd marginal of the LCX  Proximal aortic root dilatation.  Several days after cath: Echo: EF 49 %. [name]     ??? Hypercholesterolemia 06/24/2011   ??? Bicuspid aortic valve 08/01/2013     Current Outpatient Prescriptions   Medication Sig Dispense Refill   ??? atorvastatin (LIPITOR) 80 mg tablet Take 80 mg by mouth daily.       ??? aspirin delayed-release 325 mg tablet Take  by mouth every six (6) hours as needed for Pain.       ??? naproxen sodium (ALEVE) 220 mg Cap Take  by mouth.           No Known Allergies  .  History   Smoking status   ??? Never Smoker    Smokeless tobacco   ??? Not on file       Review of Systems  Review of Systems - occ pruritus ani . Denies rectal bleeding problem. . Urination stable vs last yr;  more frequent and slower vs yesteryear .       Physical Examination:  BP 100/72    Ht 5' 11"  (1.803 m)    Wt 216 lb (97.977 kg)    BMI 30.14 kg/m2     Physical Exam    General: Appears in no acute distress  Lung-clear   Heart - reg Lillyahna Hemberger/o mgr   Abdomen -BS+ soft, nt   Rectal ;smooth prostate, heme neg ; no mass felt.   Extremities -2 +radial pulses . Left elbow pain reproduced with  forced wrist flexion . No swelling  No  Hand or feet edema .   Grips , LT sensation  in arms intact.   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     . No results found for this or any previous visit.     Assessment/Plan    ICD-9-CM    1. Thoracic aortic aneurysm (HCC) 441.2 atorvastatin (LIPITOR) 80 mg tablet     aspirin delayed-release 325 mg tablet     PR COLLECTION VENOUS BLOOD,VENIPUNCTURE     SED RATE (ESR)     RPR     PROSTATE SPECIFIC AG (PSA)   2. Acute extremity pain 729.5 atorvastatin (LIPITOR) 80 mg tablet     aspirin delayed-release 325 mg tablet     PR COLLECTION VENOUS BLOOD,VENIPUNCTURE     SED RATE (ESR)     RPR     PROSTATE SPECIFIC AG (PSA)   3. Encounter for long-term (current) use of other medications V58.69 atorvastatin (  LIPITOR) 80 mg tablet     aspirin delayed-release 325 mg tablet     PR COLLECTION VENOUS BLOOD,VENIPUNCTURE     SED RATE (ESR)     RPR     PROSTATE SPECIFIC AG (PSA)   4. Special screening for malignant neoplasm of prostate V76.44 atorvastatin (LIPITOR) 80 mg tablet     aspirin delayed-release 325 mg tablet     PR COLLECTION VENOUS BLOOD,VENIPUNCTURE     SED RATE (ESR)     RPR     PROSTATE SPECIFIC AG (PSA)     Check labs . 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 P: rest.   Follow up in 1 year or sooner PRN.    Author:  Smith Robert, MD 3:52 PM4/13/2015

## 2013-08-02 LAB — RPR
RPR: NONREACTIVE
RPR: NONREACTIVE

## 2013-08-02 LAB — SED RATE (ESR): Sed rate (ESR): 2 mm/hr (ref 0–15)

## 2013-08-13 LAB — PSA, DIAGNOSTIC (PROSTATE SPECIFIC AG): Prostate Specific Ag: 0.3 ng/mL (ref 0.0–4.0)

## 2013-08-13 NOTE — Progress Notes (Signed)
Quick Note:    Pc. Gave result. F/u prn  ______

## 2013-09-05 LAB — AMB POC COMPLETE CBC,AUTOMATED ENTER
ABS. GRANS (POC): 5.5 10*3/uL (ref 1.4–6.5)
ABS. LYMPHS (POC): 2.6 10*3/uL (ref 1.2–3.4)
ABS. MONOS (POC): 0.6 10*3/uL (ref 0.1–0.6)
GRANULOCYTES (POC): 63.1 % (ref 42.2–75.2)
HCT (POC): 39.3 % (ref 35.0–60.0)
HGB (POC): 12.6 g/dL (ref 11–18)
LYMPHOCYTES (POC): 30.4 % (ref 20.5–51.1)
MCH (POC): 29.7 pg (ref 27.0–31.0)
MCHC (POC): 32.2 g/dL — AB (ref 33.0–37.0)
MCV (POC): 92.2 fL (ref 80.0–99.9)
MONOCYTES (POC): 6.5 % (ref 1.7–9.3)
MPV (POC): 6.9 fL — AB (ref 7.8–11)
PLATELET (POC): 531 10*3/uL — AB (ref 150–450)
RBC (POC): 4.26 M/uL (ref 4.00–6.00)
RDW (POC): 12.7 % (ref 11.6–13.7)
WBC (POC): 8.7 10*3/uL (ref 4.5–10.5)

## 2013-09-05 MED ORDER — HYDROCODONE-ACETAMINOPHEN 5 MG-325 MG TAB
5-325 mg | ORAL_TABLET | ORAL | Status: DC
Start: 2013-09-05 — End: 2013-09-15

## 2013-09-05 NOTE — Patient Instructions (Signed)
Rest as needed  Wear compression hose on the trip in the car and if you need to sit for 2 hours or more at a time  Repeat CBC platelet count in 2 weeks.   DMV placard.   Call if vision worsens.

## 2013-09-05 NOTE — Progress Notes (Signed)
Jason Shah is a 48 y.o. male     SUBJECTIVE:     [facility] , pt had  Aortic  Bicuspid valve repair, TAA repair and  LCX single vessel  CABG .   Feels well.  Occasional dizziness. Denies palpitations    Hx Migraines.  Occasional  blurry vision  Lasts 10 minutes, tends to occur around meds and exertion .   Breathing ok . No sig cough .  denies ex cp.     Vision slowly worse over past  6 m     Past Medical History   Diagnosis Date   ??? Eczema 12/30/2010   ??? Depression 12/30/2010   ??? Bronchitis 12/30/2010   ??? 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 Cath:   LAD obstruction   ; tx: 2 stents. Also has obstruction  in  2nd marginal of the LCX  Proximal aortic root dilatation.  Several days after cath: Echo: EF 49 %. [name].     ??? Hypercholesterolemia 06/24/2011   ??? Bicuspid aortic valve 08/01/2013     Current Outpatient Prescriptions   Medication Sig Dispense Refill   ??? aspirin 81 mg chewable tablet Take 81 mg by mouth daily.       ??? acetaminophen (TYLENOL) 325 mg tablet Take  by mouth every four (4) hours as needed for Pain.       ??? metoprolol (LOPRESSOR) 25 mg tablet Take 12.5 mg by mouth two (2) times a day.       ??? traMADol (ULTRAM) 50 mg tablet Take 50 mg by mouth every six (6) hours as needed for Pain.       ??? senna (SENNA) 8.6 mg tablet Take 1 Tab by mouth two (2) times a day.       ??? multivitamin, stress formula (STRESS TAB) tablet Take 1 Tab by mouth daily.       ??? furosemide (LASIX) 20 mg tablet Take  by mouth daily.       ??? potassium chloride SR (KLOR-CON 10) 10 mEq tablet Take 10 mEq by mouth daily.       ??? docusate sodium (COLACE) 100 mg capsule Take 100 mg by mouth two (2) times a day.       ??? magnesium hydroxide (MILK OF MAGNESIA) 400 mg/5 mL suspension Take 30 mL by mouth daily as needed for Constipation.       ??? HYDROcodone-acetaminophen (NORCO) 5-325 mg per tablet 1-2 PO Q4 -Q6 hrs pain .  100 Tab  0   ??? atorvastatin (LIPITOR) 80 mg tablet Take 80 mg by mouth  daily.       ??? naproxen sodium (ALEVE) 220 mg Cap Take  by mouth.           No Known Allergies  .  History   Smoking status   ??? Never Smoker    Smokeless tobacco   ??? Not on file       Review of Systems  Review of Systems - negative except as per HPI      Physical Examination:  BP 100/80   Temp(Src) 98.8 ??F (37.1 ??C)   Wt 205 lb 12.8 oz (93.35 kg)  Physical Exam    General: Appears in no acute distress  EOMI . Counts fingers well . PERRLA. Disks flat   Wounds healting well : sternum , abdomen ,rt groin, rt medial knee .   Lung-clear  Heart - reg Elan Brainerd/o mgr   Abdomen -neg   Extremities - no pedal edema     .   Results for orders placed in visit on 09/05/13   AMB POC COMPLETE CBC,AUTOMATED ENTER       Result Value Ref Range    WBC (POC) 8.7  4.5 - 10.5 K/uL    LYMPHOCYTES (POC) 30.4  20.5 - 51.1 %    MONOCYTES (POC) 6.5  1.7 - 9.3 %    GRANULOCYTES (POC) 63.1  42.2 - 75.2 %    ABS. LYMPHS (POC) 2.6  1.2 - 3.4 K/uL    ABS. MONOS (POC) 0.6  0.1 - 0.6 10^3/ul    ABS. GRANS (POC) 5.5  1.4 - 6.5 10^3/ul    RBC (POC) 4.26  4.00 - 6.00 M/uL    HGB (POC) 12.6  11 - 18 g/dL    HCT (POC) 16.139.3  09.635.0 - 60.0 %    MCV (POC) 92.2  80.0 - 99.9 fL    MCH (POC) 29.7  27.0 - 31.0 pg    MCHC (POC) 32.2 (*) 33.0 - 37.0 g/dL    RDW (POC) 04.512.7  40.911.6 - 13.7 %    PLATELET (POC) 531 (*) 150 - 450 K/uL    MPV (POC) 6.9 (*) 7.8 - 11 fL        Assessment/Plan    ICD-9-CM    1. Hypercholesterolemia 272.0 METABOLIC PANEL, COMPREHENSIVE     AMB POC COMPLETE CBC,AUTOMATED ENTER   2. Encounter for long-term (current) use of other medications V58.69 METABOLIC PANEL, COMPREHENSIVE     AMB POC COMPLETE CBC,AUTOMATED ENTER   3. Vision disturbance 368.9      Vision disturbance.  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 Narcotics could be affecting vision as well. Check labs.   Minor thrombocytosis. Repeat CBC in 2 weeks.   Gave Hand[employer]ap temporary placard.   TEDS hose for upcoming trip of 8 + hrs each way. Get out in walk every 90 mins.   Follow up sooner  PRN.    Author:   Jill SideW Cliff Hendrix IV, MD [phone] PM5/18/2015

## 2013-09-06 LAB — METABOLIC PANEL, COMPREHENSIVE
A-G Ratio: 1.8 (ref 1.1–2.5)
ALT (SGPT): 30 IU/L (ref 0–44)
AST (SGOT): 32 IU/L (ref 0–40)
Albumin: 4.2 g/dL (ref 3.5–5.5)
Alk. phosphatase: 93 IU/L (ref 39–117)
BUN/Creatinine ratio: 17 (ref 9–20)
BUN: 26 mg/dL — ABNORMAL HIGH (ref 6–24)
Bilirubin, total: 0.4 mg/dL (ref 0.0–1.2)
CO2: 21 mmol/L (ref 18–29)
Calcium: 9.1 mg/dL (ref 8.7–10.2)
Chloride: 101 mmol/L (ref 97–108)
Creatinine: 1.49 mg/dL — ABNORMAL HIGH (ref 0.76–1.27)
GFR est AA: 63 mL/min/{1.73_m2} (ref >59)
GFR est non-AA: 55 mL/min/{1.73_m2} — ABNORMAL LOW (ref >59)
GLOBULIN, TOTAL: 2.4 g/dL (ref 1.5–4.5)
Glucose: 107 mg/dL — ABNORMAL HIGH (ref 65–99)
Potassium: 4.5 mmol/L (ref 3.5–5.2)
Protein, total: 6.6 g/dL (ref 6.0–8.5)
Sodium: 139 mmol/L (ref 134–144)

## 2013-09-07 NOTE — Progress Notes (Signed)
Quick Note:    L/M results. F/u as scheduled or sooner prn  ______

## 2013-09-15 MED ORDER — METOPROLOL TARTRATE 25 MG TAB
25 mg | ORAL_TABLET | Freq: Two times a day (BID) | ORAL | Status: DC
Start: 2013-09-15 — End: 2014-01-04

## 2013-09-15 MED ORDER — HYDROCODONE-ACETAMINOPHEN 5 MG-325 MG TAB
5-325 mg | ORAL_TABLET | ORAL | Status: DC
Start: 2013-09-15 — End: 2014-01-20

## 2013-09-15 NOTE — Telephone Encounter (Signed)
-----   Message from [place] sent at 09/15/2013  9:44 AM EDT -----  Regarding: speak to Dr   [name]: [phone]  Jason Shah is calling to speak to Dr.  Re surgery he had.  # [phone]

## 2013-09-15 NOTE — Progress Notes (Signed)
Renewed Norco prn and Metoprolol

## 2013-09-15 NOTE — Telephone Encounter (Signed)
Called pt.

## 2013-09-19 LAB — AMB POC COMPLETE CBC,AUTOMATED ENTER
ABS. GRANS (POC): 6 10*3/uL (ref 1.4–6.5)
ABS. LYMPHS (POC): 2.4 10*3/uL (ref 1.2–3.4)
ABS. MONOS (POC): 0.3 10*3/uL (ref 0.1–0.6)
GRANULOCYTES (POC): 69.3 % (ref 42.2–75.2)
HCT (POC): 38.7 % (ref 35.0–60.0)
HGB (POC): 12.4 g/dL (ref 11–18)
LYMPHOCYTES (POC): 27.5 % (ref 20.5–51.1)
MCH (POC): 27.9 pg (ref 27.0–31.0)
MCHC (POC): 32.1 g/dL — AB (ref 33.0–37.0)
MCV (POC): 87 fL (ref 80.0–99.9)
MONOCYTES (POC): 3.2 % (ref 1.7–9.3)
MPV (POC): 7 fL — AB (ref 7.8–11)
PLATELET (POC): 405 10*3/uL (ref 150–450)
RBC (POC): 4.45 M/uL (ref 4.00–6.00)
RDW (POC): 12.9 % (ref 11.6–13.7)
WBC (POC): 8.7 10*3/uL (ref 4.5–10.5)

## 2013-09-19 NOTE — Progress Notes (Signed)
Jason Shah is a 48 y.o. male     SUBJECTIVE:     mild lightheaded . No cp .   sebacous scyst . Popped by wife  Past Medical History   Diagnosis Date   ??? Eczema 12/30/2010   ??? Depression 12/30/2010   ??? Bronchitis 12/30/2010   ??? 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 In [name]. Cath:   LAD obstruction   ; tx: 2 stents. Also has obstruction  in  2nd marginal of the LCX  Proximal aortic root dilatation.  Several days after cath: Echo: EF 49 %. [name].     ??? Hypercholesterolemia 06/24/2011   ??? Bicuspid aortic valve 08/01/2013     Current Outpatient Prescriptions   Medication Sig Dispense Refill   ??? metoprolol (LOPRESSOR) 25 mg tablet Take 0.5 Tabs by mouth two (2) times a day.  15 Tab  6   ??? HYDROcodone-acetaminophen (NORCO) 5-325 mg per tablet 1-2 PO Q4 -Q6 hrs pain .  100 Tab  0   ??? aspirin 81 mg chewable tablet Take 81 mg by mouth daily.       ??? acetaminophen (TYLENOL) 325 mg tablet Take  by mouth every four (4) hours as needed for Pain.       ??? traMADol (ULTRAM) 50 mg tablet Take 50 mg by mouth every six (6) hours as needed for Pain.       ??? senna (SENNA) 8.6 mg tablet Take 1 Tab by mouth two (2) times a day.       ??? multivitamin, stress formula (STRESS TAB) tablet Take 1 Tab by mouth daily.       ??? docusate sodium (COLACE) 100 mg capsule Take 100 mg by mouth two (2) times a day.       ??? magnesium hydroxide (MILK OF MAGNESIA) 400 mg/5 mL suspension Take 30 mL by mouth daily as needed for Constipation.       ??? atorvastatin (LIPITOR) 80 mg tablet Take 80 mg by mouth daily.       ??? naproxen sodium (ALEVE) 220 mg Cap Take  by mouth.           No Known Allergies  .  History   Smoking status   ??? Never Smoker    Smokeless tobacco   ??? Not on file       Review of Systems  Review of Systems - negative except as per HPI      Physical Examination:  BP 100/60   Pulse 72  Physical Exam    General: Appears in no acute distress  No cyst on back   Lung-clear   Heart - reg Darrel Baroni.o mgr   Abdomen -bs+soft, nt   Extremities -no pedal  edema     .   Results for orders placed in visit on 09/19/13   AMB POC COMPLETE CBC,AUTOMATED ENTER       Result Value Ref Range    WBC (POC) 8.7  4.5 - 10.5 K/uL    LYMPHOCYTES (POC) 27.5  20.5 - 51.1 %    MONOCYTES (POC) 3.2  1.7 - 9.3 %    GRANULOCYTES (POC) 69.3  42.2 - 75.2 %    ABS. LYMPHS (POC) 2.4  1.2 - 3.4 K/uL    ABS. MONOS (POC) 0.3  0.1 - 0.6 10^3/ul    ABS. GRANS (POC) 6.0  1.4 - 6.5 10^3/ul    RBC (POC) 4.45  4.00 - 6.00 M/uL  HGB (POC) 12.4  11 - 18 g/dL    HCT (POC) 30.0  92.3 - 60.0 %    MCV (POC) 87.0  80.0 - 99.9 fL    MCH (POC) 27.9  27.0 - 31.0 pg    MCHC (POC) 32.1 (*) 33.0 - 37.0 g/dL    RDW (POC) 30.0  76.2 - 13.7 %    PLATELET (POC) 405  150 - 450 K/uL    MPV (POC) 7.0 (*) 7.8 - 11 fL        Assessment/Plan    ICD-9-CM    1. Encounter for long-term (current) use of other medications V58.69 AMB POC COMPLETE CBC,AUTOMATED ENTER         Follow up in 2months or PRN.    Author:  Jill Side, MD 3:55 PM6/04/2013

## 2013-09-19 NOTE — Progress Notes (Signed)
cbc

## 2013-09-19 NOTE — Patient Instructions (Signed)
Stop Metoprolol ,call if flushing .

## 2013-10-10 NOTE — Telephone Encounter (Signed)
10/10/2013 Cardiac Wellness. Called Mr. Jason Shah on 10/10/2013 to remind of intake appointment on Wednesday June 22.  Confirmed appointment with patient and patient is coming in early to fill out forms.  Also reminded pt to bring a list of current medications, a personal schedule, $20 material fee and to wear comfortable clothes.  Asked pt to call back if appointment needs to be cancelled or rescheduled.  Shanda BumpsJessica Cowherd

## 2013-10-12 NOTE — Progress Notes (Signed)
Nutrition ITP incomplete pending consult with RD. ITP ready to sign.

## 2013-10-12 NOTE — Other (Signed)
Jason Shah  48 y.o.  presented to cardiac wellness for orientation and exercise tolerance test today with a primary diagnosis of CABG x 1 and Aortic root replacement done at [facility] by Dr. Bryson CoronaSabik on 08-26-13.   Jason Shah has a history of a anterior wall MI and 2 stent placement in 2013.  He has a bicuspid aortic valve that they will continue to monitor.  Cardiac risk factors include dyslipidemia, prior MI and these were reviewed with [facility].   Jason Shah is married and lives with his wife.  He has 3 college age sons.  One son attends [employer] and the other son is at [employer].  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 CES-D, depression score, is 14 and this is considered mild. The result was discussed with patient who feels it is getting better.  He has difficulty sleeping now due to his incision discomfort but it is improving as time goes on.   Patient denied chest pain or SOB during 6 minute walk and was in SR with BBB.    Jason Shah will  exercise 2- 3 days a week in cardiac wellness. EF is 51.  Jason Shah would like to get back to his active lifestyle before his surgery.  He will work with his cardiologist and Cardiac Wellness team in determining when he can exercise safely at an outside health facility.      Kerrie BuffaloEBORAH W BLUNT, RN  10/12/2013

## 2013-10-24 NOTE — Progress Notes (Signed)
ITP ready to sign.

## 2013-11-10 NOTE — Progress Notes (Signed)
ITP ready to sign.

## 2013-11-29 ENCOUNTER — Ambulatory Visit (INDEPENDENT_AMBULATORY_CARE_PROVIDER_SITE_OTHER): Payer: 59 | Admitting: Medical

## 2013-11-29 ENCOUNTER — Encounter: Payer: Self-pay | Admitting: Medical

## 2013-11-29 ENCOUNTER — Telehealth: Payer: Self-pay | Admitting: Medical

## 2013-11-29 VITALS — BP 130/80 | HR 66 | Temp 98.1°F | Resp 16 | Wt 220.0 lb

## 2013-11-29 DIAGNOSIS — K921 Melena: Secondary | ICD-10-CM

## 2013-11-29 LAB — CBC
HCT: 42.2 % (ref 39.0–52.0)
HEMOGLOBIN: 14.7 g/dL (ref 13.0–17.0)
MCH: 29.6 pg (ref 26.0–34.0)
MCHC: 34.8 g/dL (ref 30.0–36.0)
MCV: 84.9 fL (ref 78.0–100.0)
Platelets: 356 10*3/uL (ref 150–400)
RBC: 4.97 MIL/uL (ref 4.22–5.81)
RDW: 13.6 % (ref 11.5–15.5)
WBC: 9.1 10*3/uL (ref 4.0–10.5)

## 2013-11-29 LAB — COMPREHENSIVE METABOLIC PANEL
ALBUMIN: 4.2 g/dL (ref 3.5–5.2)
ALT: 15 U/L (ref 0–53)
AST: 14 U/L (ref 0–37)
Alkaline Phosphatase: 76 U/L (ref 39–117)
BUN: 11 mg/dL (ref 6–23)
CALCIUM: 9.5 mg/dL (ref 8.4–10.5)
CHLORIDE: 98 meq/L (ref 96–112)
CO2: 30 mEq/L (ref 19–32)
Creat: 1.42 mg/dL — ABNORMAL HIGH (ref 0.50–1.35)
Glucose, Bld: 62 mg/dL — ABNORMAL LOW (ref 70–99)
POTASSIUM: 4.3 meq/L (ref 3.5–5.3)
Sodium: 136 mEq/L (ref 135–145)
Total Bilirubin: 0.3 mg/dL (ref 0.2–1.2)
Total Protein: 8.1 g/dL (ref 6.0–8.3)

## 2013-11-29 NOTE — Progress Notes (Signed)
Subjective:    Thomas Harvey is a 48 y.o. male here for evaluation of blood in stool.  First noticed blood in stool a month ago.  Occurred initially for a week straight.   someday's its heavy, sometimes light. Blood has been intermittent. Last night no blood for example.  denies black stool, denies coffee grind color in the stool.   occasionally strains on the toilet.   Usually in the bathroom briefly but sometimes spends some time reading on the toilet.   Has seen blood from time to time in the past, but this was more persistent.  No foods seem to change to appearance.   Works at [employer] out, does eat some spicy chicken sandwich.   No other bleeding, no back or belly pain, no bruising, no fever, no weight loss, no night sweats, no anorexia.   Using nothing for this.    No obvious hemorrhoids hanging out.   No pain with BMs.  Sometimes has itching and burning at the anus.   Patient has associated symptoms of none. The patient denies abdominal pain, alternating loose stools and constipation, constipation, diarrhea and loose stools. The patient has a known history of: no known risk factors. There is not a history of rectal injury. Patient has had similar episodes of rectal bleeding in the past.  The following portions of the patient's history were reviewed and updated as appropriate: allergies, current medications, past family history, past medical history, past social history, past surgical history and problem list.  Review of Systems Constitutional: denies fever, chills, sweats, unexpected weight change, anorexia, fatigue Dermatology: denies changing moles, rash, lumps, new worrisome lesions ENT: no runny nose, ear pain, sore throat, hoarseness, sinus pain, teeth pain, tinnitus, hearing loss, epistaxis Cardiology: denies chest pain, palpitations, edema, orthopnea, paroxysmal nocturnal dyspnea Respiratory: denies cough, shortness of breath, dyspnea on exertion, wheezing, hemoptysis Gastroenterology:  denies abdominal pain, nausea, vomiting, diarrhea, constipation, changes in bowel movement, dysphagia Hematology: denies bleeding or bruising problems Musculoskeletal: denies arthralgias, myalgias, joint swelling, back pain, neck pain, cramping, gait changes Urology: denies dysuria, difficulty urinating, hematuria, urinary frequency, urgency, incontinence      Objective:   Filed Vitals:   11/29/13 1026  BP: 130/80  Pulse: 66  Temp: 98.1 F (36.7 C)  Resp: 16    General appearance: alert, no distress, WD/WN, male Neck: supple, no lymphadenopathy, no thyromegaly, no masses, normal ROM Heart: RRR, normal S1, S2, no murmurs Lungs: CTA bilaterally, no wheezes, rhonchi, or rales Abdomen: +bs, soft, non tender, non distended, no masses, no hepatomegaly, no splenomegaly, no bruits Back: non tender, normal ROM, no scoliosis Rectal: Anus normal-appearing, no obvious hemorrhoid, guaiac negative       Assessment:    Rectal bleeding, likely secondary to internal hemorrhoid, but refer to GI to rule out other since it is longstanding.    Encounter Diagnosis  Name Primary?  . Blood in stool Yes    Plan:     We discussed his symptoms and concerns.  Will get baseline labs to rule out anemia, we'll refer to gastroenterology for further evaluation given the long-standing bleeding and his age.  Advised he limit NSAIDs, alcohol, spicy foods, try to get good fiber and water intake daily.  He understands and agrees to plan

## 2013-11-29 NOTE — Telephone Encounter (Signed)
Refer to gastroenterology due to blood in the stool.   Let us go with one of the [facility] doctors

## 2013-11-30 ENCOUNTER — Other Ambulatory Visit: Payer: Self-pay | Admitting: Family Medicine

## 2013-11-30 ENCOUNTER — Encounter: Payer: Self-pay | Admitting: Family Medicine

## 2013-11-30 DIAGNOSIS — K921 Melena: Secondary | ICD-10-CM

## 2013-11-30 NOTE — Telephone Encounter (Signed)
Patient is aware of his appointment to see Dr. Collene Mares on 12/01/13 @ 315 pm. CLS I fax records through [facility]. CLS

## 2013-12-12 NOTE — Progress Notes (Signed)
ITP ready to sign.

## 2014-01-05 NOTE — Progress Notes (Signed)
ITP ready to sign.

## 2014-01-20 ENCOUNTER — Encounter: Payer: MEDICAID | Primary: Internal Medicine

## 2014-01-20 ENCOUNTER — Ambulatory Visit
Admit: 2014-01-20 | Discharge: 2014-01-20 | Payer: PRIVATE HEALTH INSURANCE | Attending: Internal Medicine | Primary: Internal Medicine

## 2014-01-20 DIAGNOSIS — J309 Allergic rhinitis, unspecified: Secondary | ICD-10-CM

## 2014-01-20 NOTE — Progress Notes (Signed)
Jason Shah is a 48 y.o. male     SUBJECTIVE:    1 m runny and stuffy  Nose; mild cough Jason Shah/  clear sputum despite Zyrtec,   Claritin,  and Albuterol MDI Prn  , able to ref [employer]. Denies fever     Past Medical History   Diagnosis Date   ??? Eczema 12/30/2010   ??? Depression 12/30/2010   ??? Bronchitis 12/30/2010   ??? 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 In [place]. Cath:   LAD obstruction   ; tx: 2 stents. Also has obstruction  in  2nd marginal of the LCX  Proximal aortic root dilatation.  Several days after cath: Echo: EF 49 %. [name]     ??? Hypercholesterolemia 06/24/2011   ??? Bicuspid aortic valve 08/01/2013   ??? CAD (coronary artery disease)      Current Outpatient Prescriptions   Medication Sig Dispense Refill   ??? albuterol (PROAIR HFA) 90 mcg/actuation inhaler Take 2 Puffs by inhalation every four (4) hours as needed for Wheezing.     ??? aspirin 81 mg chewable tablet Take 162 mg by mouth daily.     ??? acetaminophen (TYLENOL) 325 mg tablet Take  by mouth every four (4) hours as needed for Pain.     ??? traMADol (ULTRAM) 50 mg tablet Take 50 mg by mouth every six (6) hours as needed for Pain.     ??? multivitamin, stress formula (STRESS TAB) tablet Take 1 Tab by mouth daily.     ??? atorvastatin (LIPITOR) 80 mg tablet Take 80 mg by mouth daily.     ??? naproxen sodium (ALEVE) 220 mg Cap Take  by mouth.         No Known Allergies  .  History   Smoking status   ??? Never Smoker    Smokeless tobacco   ??? Not on file       Review of Systems  Review of Systems - negative except as per HPI      Physical Examination:  BP 120/86 mmHg   Temp(Src) 98.3 ??F (36.8 ??C)   Wt 205 lb (92.987 kg)  Physical Exam    General: Appears in no acute distress  ENT: throat clear   Lung-clear   Heart - reg   Abdomen -bs+ soft nt   Extremities - no ankle edema     .   Results for orders placed or performed in visit on 09/19/13   AMB POC COMPLETE CBC,AUTOMATED ENTER   Result Value Ref Range    WBC (POC) 8.7 4.5 - 10.5 K/uL     LYMPHOCYTES (POC) 27.5 20.5 - 51.1 %    MONOCYTES (POC) 3.2 1.7 - 9.3 %    GRANULOCYTES (POC) 69.3 42.2 - 75.2 %    ABS. LYMPHS (POC) 2.4 1.2 - 3.4 K/uL    ABS. MONOS (POC) 0.3 0.1 - 0.6 10^3/ul    ABS. GRANS (POC) 6.0 1.4 - 6.5 10^3/ul    RBC (POC) 4.45 4.00 - 6.00 M/uL    HGB (POC) 12.4 11 - 18 g/dL    HCT (POC) 13.0 86.5 - 60.0 %    MCV (POC) 87.0 80.0 - 99.9 fL    MCH (POC) 27.9 27.0 - 31.0 pg    MCHC (POC) 32.1 (A) 33.0 - 37.0 g/dL    RDW (POC) 78.4 69.6 - 13.7 %    PLATELET (POC) 405 150 - 450 K/uL    MPV (POC)  7.0 (A) 7.8 - 11 fL        Assessment/Plan    ICD-10-CM ICD-9-CM    1. Allergic rhinitis, unspecified allergic rhinitis type J30.9 477.9        Advised Nasacort AQ. F/u prn       Author:  Ysidro EvertW Clifford Hendrix IV, MD 2:25 PM10/06/2013

## 2014-01-20 NOTE — Patient Instructions (Signed)
Nasacort AQ 2 sprays each nostril daily.   Continue Claritin one daily for the next 3 days at least  Call back if not improving.

## 2014-01-23 ENCOUNTER — Inpatient Hospital Stay: Admit: 2014-01-23 | Discharge: 2014-01-23 | Payer: MEDICAID | Primary: Internal Medicine

## 2014-01-23 DIAGNOSIS — Z951 Presence of aortocoronary bypass graft: Secondary | ICD-10-CM

## 2014-01-25 ENCOUNTER — Encounter: Payer: MEDICAID | Primary: Internal Medicine

## 2014-01-27 ENCOUNTER — Encounter: Payer: MEDICAID | Primary: Internal Medicine

## 2014-02-01 ENCOUNTER — Inpatient Hospital Stay: Admit: 2014-02-01 | Discharge: 2014-02-01 | Payer: MEDICAID | Primary: Internal Medicine

## 2014-02-03 ENCOUNTER — Encounter: Payer: MEDICAID | Primary: Internal Medicine

## 2014-02-06 ENCOUNTER — Inpatient Hospital Stay: Admit: 2014-02-06 | Discharge: 2014-02-06 | Payer: MEDICAID | Primary: Internal Medicine

## 2014-02-08 ENCOUNTER — Encounter: Payer: MEDICAID | Primary: Internal Medicine

## 2014-02-10 ENCOUNTER — Inpatient Hospital Stay: Admit: 2014-02-10 | Discharge: 2014-02-10 | Payer: MEDICAID | Primary: Internal Medicine

## 2014-02-13 ENCOUNTER — Inpatient Hospital Stay: Payer: MEDICAID | Primary: Internal Medicine

## 2014-02-15 ENCOUNTER — Inpatient Hospital Stay: Admit: 2014-02-15 | Discharge: 2014-02-15 | Payer: MEDICAID | Primary: Internal Medicine

## 2014-02-17 ENCOUNTER — Inpatient Hospital Stay: Admit: 2014-02-17 | Discharge: 2014-02-17 | Payer: MEDICAID | Primary: Internal Medicine

## 2014-02-17 NOTE — Progress Notes (Signed)
Discharge ITP is ready to sign.

## 2014-02-20 ENCOUNTER — Encounter: Payer: MEDICAID | Primary: Internal Medicine

## 2014-02-22 ENCOUNTER — Encounter: Payer: MEDICAID | Primary: Internal Medicine

## 2014-04-12 NOTE — Other (Signed)
Jason Shah  Completed phase II cardiac rehab and attended 36 sessions from 10/12/13 - 02/17/14.   Jason Shah is interested in maintaining optimal health and will work with Dr. Hanley Benunnington.  Jason Shah has  improved his endurance and stamina through regular exercise, gained 6 lbs and maintained his waist measurement of 38 inches. Blood pressure is 104/70 and is WNL.   He has also improved his/her nutrition, [employer] and depression scores and these were reviewed with patient.     Jason Shah plans to continue exercising at a gym and has set revised goals that include exercising 5 -7 days a week for 45-60 minutes.  [name], RN  04/12/2014

## 2014-04-18 ENCOUNTER — Ambulatory Visit
Admit: 2014-04-18 | Discharge: 2014-04-18 | Payer: PRIVATE HEALTH INSURANCE | Attending: Internal Medicine | Primary: Internal Medicine

## 2014-04-18 DIAGNOSIS — J019 Acute sinusitis, unspecified: Secondary | ICD-10-CM

## 2014-04-18 MED ORDER — AZITHROMYCIN 250 MG TAB
250 mg | ORAL_TABLET | ORAL | Status: DC
Start: 2014-04-18 — End: 2014-05-19

## 2014-04-18 MED ORDER — METHYLPREDNISOLONE 4 MG TABS IN A DOSE PACK
4 mg | ORAL | Status: DC
Start: 2014-04-18 — End: 2014-05-19

## 2014-04-18 NOTE — Progress Notes (Signed)
Jason Shah is a 48 y.o. male     SUBJECTIVE:  3 mos Rhintis despite alternating Flonase and Nasacort and using Claritin   5 days ago :  post nasal drip and URI   Now with increased  yellow nasal discarg.   L>R nasal congestion . + Ear Congestion .   Over months , myopia     Past Medical History   Diagnosis Date   ??? Eczema 12/30/2010   ??? Depression 12/30/2010   ??? Bronchitis 12/30/2010   ??? 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 Cath:   LAD obstruction   ; tx: 2 stents. Also has obstruction  in  2nd marginal of the LCX  Proximal aortic root dilatation.  Several days after cath: Echo: EF 49 %. [name].     ??? Hypercholesterolemia 06/24/2011   ??? Bicuspid aortic valve 08/01/2013   ??? CAD (coronary artery disease)      Current Outpatient Prescriptions   Medication Sig Dispense Refill   ??? fluticasone (FLONASE) 50 mcg/actuation nasal spray by Both Nostrils route nightly.     ??? loratadine (CLARITIN) 10 mg tablet Take 10 mg by mouth daily.     ??? methylPREDNISolone (MEDROL DOSEPACK) 4 mg tablet Per dose pack instructions 1 Each 0   ??? azithromycin (ZITHROMAX) 250 mg tablet 2 pills on day 1. Then 1 pill daily 6 Tab 0   ??? albuterol (PROAIR HFA) 90 mcg/actuation inhaler Take 2 Puffs by inhalation every four (4) hours as needed for Wheezing.     ??? aspirin 81 mg chewable tablet Take 162 mg by mouth daily.     ??? acetaminophen (TYLENOL) 325 mg tablet Take  by mouth every four (4) hours as needed for Pain.     ??? multivitamin, stress formula (STRESS TAB) tablet Take 1 Tab by mouth daily.     ??? atorvastatin (LIPITOR) 80 mg tablet Take 80 mg by mouth daily.     ??? naproxen sodium (ALEVE) 220 mg Cap Take  by mouth.         No Known Allergies  .  History   Smoking status   ??? Never Smoker    Smokeless tobacco   ??? Not on file       Review of Systems  Review of Systems - negative except as per HPI      Physical Examination:  BP 110/80 mmHg   Temp(Src) 98.1 ??F (36.7 ??C)   Wt 210 lb (95.255 kg)  Physical Exam     General: Appears in no acute distress    Antra NT .   Tms' clear   Throat clear  Antra nt   EOMI PERRLA VFF  Lung-clear  Heart - reg  Abdomen -  Extremities -     .   Results for orders placed or performed in visit on 09/19/13   AMB POC COMPLETE CBC,AUTOMATED ENTER   Result Value Ref Range    WBC (POC) 8.7 4.5 - 10.5 K/uL    LYMPHOCYTES (POC) 27.5 20.5 - 51.1 %    MONOCYTES (POC) 3.2 1.7 - 9.3 %    GRANULOCYTES (POC) 69.3 42.2 - 75.2 %    ABS. LYMPHS (POC) 2.4 1.2 - 3.4 K/uL    ABS. MONOS (POC) 0.3 0.1 - 0.6 10^3/ul    ABS. GRANS (POC) 6.0 1.4 - 6.5 10^3/ul    RBC (POC) 4.45 4.00 - 6.00 M/uL    HGB (POC) 12.4 11 -  18 g/dL    HCT (POC) 56.238.7 13.035.0 - 60.0 %    MCV (POC) 87.0 80.0 - 99.9 fL    MCH (POC) 27.9 27.0 - 31.0 pg    MCHC (POC) 32.1 (A) 33.0 - 37.0 g/dL    RDW (POC) 86.512.9 78.411.6 - 13.7 %    PLATELET (POC) 405 150 - 450 K/uL    MPV (POC) 7.0 (A) 7.8 - 11 fL        Assessment/Plan    ICD-10-CM ICD-9-CM    1. Acute sinusitis, recurrence not specified, unspecified location J01.90 461.9    2. Allergic rhinitis, unspecified allergic rhinitis type J30.9 477.9      Zpack and Medrol pack ERX's .   See Allergist prn   Advised routine eye professional exam  Follow up  PRN.    Author:  Ysidro EvertW Clifford Hendrix IV, MD [phone] PM12/30/2015

## 2014-04-18 NOTE — Patient Instructions (Signed)
Zpack   Medrol dose pack . While on it , no Ibuprofen or Aleve.   Call back if not improving  Eye exam in the next few months.

## 2014-05-19 ENCOUNTER — Ambulatory Visit
Admit: 2014-05-19 | Discharge: 2014-05-19 | Payer: PRIVATE HEALTH INSURANCE | Attending: Internal Medicine | Primary: Internal Medicine

## 2014-05-19 DIAGNOSIS — J4521 Mild intermittent asthma with (acute) exacerbation: Secondary | ICD-10-CM

## 2014-05-19 MED ORDER — AZITHROMYCIN 250 MG TAB
250 mg | ORAL_TABLET | ORAL | Status: DC
Start: 2014-05-19 — End: 2014-08-30

## 2014-05-19 MED ORDER — METHYLPREDNISOLONE 4 MG TABS IN A DOSE PACK
4 mg | ORAL_TABLET | ORAL | Status: DC
Start: 2014-05-19 — End: 2014-08-30

## 2014-05-19 NOTE — Progress Notes (Signed)
Jason Shah is a 49 y.o. male  4  wks ago rhinitis  and URI , responded to Zpack and Medrol Pack .     SUBJECTIVE:  3 weeks ago  post nasal drip , ear ache b/l and sore throat . Then  2 1/2 wks ago developed  cough  T99.7 . tx Albuterol MDI and Mucinex. Still cough : producing usually mucoid sputum .     Past Medical History   Diagnosis Date   ??? Eczema 12/30/2010   ??? Depression 12/30/2010   ??? Bronchitis 12/30/2010   ??? 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 In [name] Cath:   LAD obstruction   ; tx: 2 stents. Also has obstruction  in  2nd marginal of the LCX  Proximal aortic root dilatation.  Several days after cath: Echo: EF 49 %. [name].     ??? Hypercholesterolemia 06/24/2011   ??? Bicuspid aortic valve 08/01/2013   ??? CAD (coronary artery disease)      Current Outpatient Prescriptions   Medication Sig Dispense Refill   ??? methylPREDNISolone (MEDROL DOSEPACK) 4 mg tablet As directed 1 Tab 0   ??? azithromycin (ZITHROMAX) 250 mg tablet 2 pills day 1 . Then 1 pill daily 6 Tab 0   ??? fluticasone (FLONASE) 50 mcg/actuation nasal spray by Both Nostrils route nightly.     ??? loratadine (CLARITIN) 10 mg tablet Take 10 mg by mouth daily.     ??? albuterol (PROAIR HFA) 90 mcg/actuation inhaler Take 2 Puffs by inhalation every four (4) hours as needed for Wheezing.     ??? aspirin 81 mg chewable tablet Take 162 mg by mouth daily.     ??? acetaminophen (TYLENOL) 325 mg tablet Take  by mouth every four (4) hours as needed for Pain.     ??? multivitamin, stress formula (STRESS TAB) tablet Take 1 Tab by mouth daily.     ??? atorvastatin (LIPITOR) 80 mg tablet Take 80 mg by mouth daily.     ??? naproxen sodium (ALEVE) 220 mg Cap Take  by mouth.         No Known Allergies  .  History   Smoking status   ??? Never Smoker    Smokeless tobacco   ??? Not on file       Review of Systems  Review of Systems - negative except as per HPI      Physical Examination:  BP 140/98 mmHg   Temp(Src) 97.2 ??F (36.2 ??C)   Wt 214 lb (97.07 kg)  Physical Exam     General: Appears in no acute distress  Throat clear  Lung-clear   Heart - reg  Abdomen -  Extremities -     .   Results for orders placed or performed in visit on 09/19/13   AMB POC COMPLETE CBC,AUTOMATED ENTER   Result Value Ref Range    WBC (POC) 8.7 4.5 - 10.5 K/uL    LYMPHOCYTES (POC) 27.5 20.5 - 51.1 %    MONOCYTES (POC) 3.2 1.7 - 9.3 %    GRANULOCYTES (POC) 69.3 42.2 - 75.2 %    ABS. LYMPHS (POC) 2.4 1.2 - 3.4 K/uL    ABS. MONOS (POC) 0.3 0.1 - 0.6 10^3/ul    ABS. GRANS (POC) 6.0 1.4 - 6.5 10^3/ul    RBC (POC) 4.45 4.00 - 6.00 M/uL    HGB (POC) 12.4 11 - 18 g/dL    HCT (POC) 16.138.7 09.635.0 - 60.0 %  MCV (POC) 87.0 80.0 - 99.9 fL    MCH (POC) 27.9 27.0 - 31.0 pg    MCHC (POC) 32.1 (A) 33.0 - 37.0 g/dL    RDW (POC) 16.1 09.6 - 13.7 %    PLATELET (POC) 405 150 - 450 K/uL    MPV (POC) 7.0 (A) 7.8 - 11 fL        Assessment/Plan    ICD-10-CM ICD-9-CM    1. Asthmatic bronchitis, mild intermittent, with acute exacerbation J45.21 493.92       ERX Zpack and Medrol pack . F/u prn     Author:  Ysidro Evert, MD [phone] PM1/30/2016

## 2014-05-19 NOTE — Patient Instructions (Signed)
Zpack   Medrol dose pack   Call back as needed

## 2014-07-20 ENCOUNTER — Encounter

## 2014-07-27 ENCOUNTER — Ambulatory Visit: Payer: MEDICAID | Primary: Internal Medicine

## 2014-07-30 ENCOUNTER — Inpatient Hospital Stay: Admit: 2014-07-30 | Payer: MEDICAID | Attending: Orthopaedic Surgery | Primary: Internal Medicine

## 2014-07-30 DIAGNOSIS — S83242A Other tear of medial meniscus, current injury, left knee, initial encounter: Secondary | ICD-10-CM

## 2014-08-30 ENCOUNTER — Ambulatory Visit: Admit: 2014-08-30 | Payer: PRIVATE HEALTH INSURANCE | Attending: Internal Medicine | Primary: Internal Medicine

## 2014-08-30 DIAGNOSIS — L989 Disorder of the skin and subcutaneous tissue, unspecified: Secondary | ICD-10-CM

## 2014-08-30 MED ORDER — CEPHALEXIN 500 MG CAP
500 mg | ORAL_CAPSULE | Freq: Three times a day (TID) | ORAL | Status: DC
Start: 2014-08-30 — End: 2015-02-22

## 2014-08-30 MED ORDER — TRIMETHOPRIM-SULFAMETHOXAZOLE 160 MG-800 MG TAB
160-800 mg | ORAL_TABLET | Freq: Two times a day (BID) | ORAL | Status: DC
Start: 2014-08-30 — End: 2015-02-22

## 2014-08-30 NOTE — Progress Notes (Signed)
Jason Willodean RosenthalG Bussiere is a 49 y.o. male     SUBJECTIVE:    Left Meniscus sgy planned in 6 days.   Pt was in a [facility] room 08/19/14. 3 days later he developed Painful pustules on his right low back ,  left chest , and left hip     Past Medical History   Diagnosis Date   ??? Eczema 12/30/2010   ??? Depression 12/30/2010   ??? Bronchitis 12/30/2010   ??? 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 In [name] Cath:   LAD obstruction   ; tx: 2 stents. Also has obstruction  in  2nd marginal of the LCX  Proximal aortic root dilatation.  Several days after cath: Echo: EF 49 %. [name].     ??? Hypercholesterolemia 06/24/2011   ??? Bicuspid aortic valve 08/01/2013   ??? CAD (coronary artery disease)      Current Outpatient Prescriptions   Medication Sig Dispense Refill   ??? cephALEXin (KEFLEX) 500 mg capsule Take 1 Cap by mouth three (3) times daily. 30 Cap 0   ??? trimethoprim-sulfamethoxazole (BACTRIM DS, SEPTRA DS) 160-800 mg per tablet Take 1 Tab by mouth two (2) times a day. 20 Tab 0   ??? fluticasone (FLONASE) 50 mcg/actuation nasal spray by Both Nostrils route nightly.     ??? loratadine (CLARITIN) 10 mg tablet Take 10 mg by mouth daily.     ??? albuterol (PROAIR HFA) 90 mcg/actuation inhaler Take 2 Puffs by inhalation every four (4) hours as needed for Wheezing.     ??? aspirin 81 mg chewable tablet Take 162 mg by mouth daily.     ??? acetaminophen (TYLENOL) 325 mg tablet Take  by mouth every four (4) hours as needed for Pain.     ??? multivitamin, stress formula (STRESS TAB) tablet Take 1 Tab by mouth daily.     ??? atorvastatin (LIPITOR) 80 mg tablet Take 80 mg by mouth daily.     ??? naproxen sodium (ALEVE) 220 mg Cap Take  by mouth.         No Known Allergies  .  History   Smoking status   ??? Never Smoker    Smokeless tobacco   ??? Not on file       Review of Systems  Review of Systems -   Denies cp ,sob , dizziness , or palpitations.     Physical Examination:  BP 130/82 mmHg   Temp(Src) 97.1 ??F (36.2 ??C)   Wt 213 lb (96.616 kg)  Physical Exam     General: Appears in no acute distress  Lung-clear   Heart -reg Kellee Sittner/o mgr   Abdomen -  Extremities - no ankle edema   Skin : right low back pink superificial desquamated area-approximately 6 cm x 4 cm area-no drainage seen -culture taken   Left chest : approx 1 cm diameter pink area with scale   Left hip : approx 2 cm diameter pink area with scale    .        Assessment/Plan    ICD-10-CM ICD-9-CM    1. Skin lesion L98.9 709.9 cephALEXin (KEFLEX) 500 mg capsule      trimethoprim-sulfamethoxazole (BACTRIM DS, SEPTRA DS) 160-800 mg per tablet      ANAEROBIC AND AEROBIC CULTURE      Suspect infectious areas . Until culture results  Return, take Bactrim and Keflex.   F/u to be arranged.     Author:  Jonn ShinglesW Clifford Hendrix IV,  MD 12:29 PM5/02/2015

## 2014-08-30 NOTE — Patient Instructions (Signed)
Keflex and Bactrim . Call in 2 days for culture results

## 2014-09-04 LAB — CULTURE, ANAEROBIC AND AEROBIC
Aerobic culture: NO GROWTH
Anerobic culture: NO GROWTH

## 2014-09-04 NOTE — Progress Notes (Signed)
Quick Note:        L/M results. Continue current Keflex and Bactrim    ______

## 2015-01-18 NOTE — Progress Notes (Signed)
lab

## 2015-02-22 ENCOUNTER — Ambulatory Visit
Admit: 2015-02-22 | Discharge: 2015-02-22 | Payer: PRIVATE HEALTH INSURANCE | Attending: Internal Medicine | Primary: Internal Medicine

## 2015-02-22 DIAGNOSIS — J40 Bronchitis, not specified as acute or chronic: Secondary | ICD-10-CM

## 2015-02-22 NOTE — Progress Notes (Signed)
Jason Shah is a 49 y.o. male     SUBJECTIVE:    2 wks sore throat  , productive  cough , headache, mild ear pain but better today   Past Medical History   Diagnosis Date   ??? 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 In [name] Cath:   LAD obstruction   ; tx: 2 stents. Also has obstruction  in  2nd marginal of the LCX  Proximal aortic root dilatation.  Several days after cath: Echo: EF 49 %. [name].     ??? Bicuspid aortic valve 08/01/2013   ??? Bronchitis 12/30/2010   ??? CAD (coronary artery disease)    ??? Depression 12/30/2010   ??? Eczema 12/30/2010   ??? Hypercholesterolemia 06/24/2011     Current Outpatient Prescriptions   Medication Sig Dispense Refill   ??? Cetirizine (ZYRTEC) 10 mg cap Take  by mouth. Daily     ??? fluticasone (FLONASE) 50 mcg/actuation nasal spray by Both Nostrils route nightly.     ??? albuterol (PROAIR HFA) 90 mcg/actuation inhaler Take 2 Puffs by inhalation every four (4) hours as needed for Wheezing.     ??? aspirin 81 mg chewable tablet Take 162 mg by mouth daily.     ??? acetaminophen (TYLENOL) 325 mg tablet Take  by mouth every four (4) hours as needed for Pain.     ??? multivitamin, stress formula (STRESS TAB) tablet Take 1 Tab by mouth daily.     ??? atorvastatin (LIPITOR) 80 mg tablet Take 80 mg by mouth daily.     ??? naproxen sodium (ALEVE) 220 mg Cap Take  by mouth.         No Known Allergies  .  History   Smoking Status   ??? Never Smoker   Smokeless Tobacco   ??? Not on file       Review of Systems  Review of Systems - negative except as per HPI  Denies chest pain . Please with left knee surgery     Physical Examination:  Visit Vitals   ??? BP 100/82   ??? Temp 98.3 ??F (36.8 ??C)   ??? Ht 5\' 11"  (1.803 m)   ??? Wt 210 lb (95.3 kg)   ??? BMI 29.29 kg/m2     Physical Exam    General: Appears in no acute distress  Neck supple   Throat minor erythema . No neck nodes felt   Ears tms cleear    Lung-clear   Heart - reg    Abdomen -bs+ soft, nt   Extremities - no ankle  edema   Slight decreased  left knee flexion      Assessment/Plan    ICD-10-CM ICD-9-CM    1. Bronchitis J40 490      Probable Viral Bronchitis. Better. Same rx   Follow up  PRN.    Author:  Ysidro EvertW. Clifford Hendrix IV, MD 12:49 PM11/06/2014

## 2015-02-22 NOTE — Patient Instructions (Signed)
Same meds  Call back as needed.   Colonoscopy to be arranged.

## 2015-02-27 ENCOUNTER — Encounter

## 2015-02-27 NOTE — Progress Notes (Signed)
Referred pt for screening colonoscopy at age  49

## 2015-03-21 ENCOUNTER — Ambulatory Visit: Admit: 2015-03-21 | Payer: PRIVATE HEALTH INSURANCE | Attending: Internal Medicine | Primary: Internal Medicine

## 2015-03-21 DIAGNOSIS — Z0000 Encounter for general adult medical examination without abnormal findings: Secondary | ICD-10-CM

## 2015-03-21 LAB — AMB POC URINALYSIS DIP STICK AUTO W/O MICRO
Bilirubin (UA POC): NEGATIVE
Glucose (UA POC): NEGATIVE
Ketones (UA POC): NEGATIVE
Leukocyte esterase (UA POC): NEGATIVE
Nitrites (UA POC): NEGATIVE
Protein (UA POC): NEGATIVE mg/dL
Specific gravity (UA POC): 1.02 (ref 1.001–1.035)
Urobilinogen (UA POC): 0.2 (ref 0.2–1)
pH (UA POC): 5.5 (ref 4.6–8.0)

## 2015-03-21 LAB — AMB POC COMPLETE CBC,AUTOMATED ENTER
ABS. GRANS (POC): 4.2 10*3/uL (ref 1.4–6.5)
ABS. LYMPHS (POC): 1.9 10*3/uL (ref 1.2–3.4)
ABS. MONOS (POC): 0.4 10*3/uL (ref 0.1–0.6)
GRANULOCYTES (POC): 64.2 % (ref 42.2–75.2)
HCT (POC): 47.5 % (ref 35.0–60.0)
HGB (POC): 15.4 g/dL (ref 11–18)
LYMPHOCYTES (POC): 29.4 % (ref 20.5–51.1)
MCH (POC): 29.3 pg (ref 27.0–31.0)
MCHC (POC): 32.4 g/dL — AB (ref 33.0–37.0)
MCV (POC): 90.3 fL (ref 80.0–99.9)
MONOCYTES (POC): 6.4 % (ref 1.7–9.3)
MPV (POC): 6.4 fL — AB (ref 7.8–11)
PLATELET (POC): 180 10*3/uL (ref 150–450)
RBC (POC): 5.26 M/uL (ref 4.00–6.00)
RDW (POC): 13 % (ref 11.6–13.7)
WBC (POC): 6.6 10*3/uL (ref 4.5–10.5)

## 2015-03-21 NOTE — Progress Notes (Signed)
Jason Shah is a 49 y.o. male    HPI  Routine Physical     Past Medical History   Diagnosis Date   ??? 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 In [place]. Cath:   LAD obstruction   ; tx: 2 stents. Also has obstruction  in  2nd marginal of the LCX  Proximal aortic root dilatation.  Several days after cath: Echo: EF 49 %. [name]     ??? Bicuspid aortic valve 08/01/2013   ??? Bronchitis 12/30/2010   ??? CAD (coronary artery disease)    ??? Depression 12/30/2010   ??? Eczema 12/30/2010   ??? Hypercholesterolemia 06/24/2011     Past Surgical History   Procedure Laterality Date   ??? Hx urological       vasectomy; vasectomy reversal .   ??? Hx other surgical       removal of cysts   ??? 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 Cetirizine (ZYRTEC) 10 mg cap Take  by mouth. Daily     ??? fluticasone (FLONASE) 50 mcg/actuation nasal spray by Both Nostrils route nightly.     ??? albuterol (PROAIR HFA) 90 mcg/actuation inhaler Take 2 Puffs by inhalation every four (4) hours as needed for Wheezing.     ??? aspirin 81 mg chewable tablet Take 162 mg by mouth daily.     ??? acetaminophen (TYLENOL) 325 mg tablet Take  by mouth every four (4) hours as needed for Pain.     ??? multivitamin, stress formula (STRESS TAB) tablet Take 1 Tab by mouth daily.     ??? atorvastatin (LIPITOR) 80 mg tablet Take 80 mg by mouth daily.     ??? naproxen sodium (ALEVE) 220 mg Cap Take  by mouth.         No Known Allergies  Family History   Problem Relation Age of Onset   ??? Heart Disease Father    ??? Asthma Mother    ??? Asthma Brother      History   Smoking Status   ??? Never Smoker   Smokeless Tobacco   ??? Not on file       Review of Systems  Denies dyspnea. Few mos ago, one day in hot weather had sob exercising ,eval'd by Dr Jason Shah . Denies further episodes sob.  Cardiac:  Denies chest pain. Rare palpitations.    GI: Bowels move regularly without hematochezia.[name]  Urination stable vs last year; but more frequent vs yesteryear   Allergic rhinitis responding to tx.     Physical Examination:  Visit Vitals   ??? BP 112/88   ??? Ht 5' 9.5" (1.765 m)   ??? Wt 212 lb (96.2 kg)   ??? BMI 30.86 kg/m2     Physical Exam  General:   Appears in no acute distress.   HEENT:   Negative                   Lungs:   Clear        Heart:  Regular without murmur, gallop or rub       Abdomen:   Benign exam without organomegaly or mass palpable  GU  :nl genitalia Jason Shah/o hernia                 Rectal:  smooth prostate ,  no mass felt    Extremities: No edema left. Knee minor pain on end flexion   Neurologic: Grossly nonfocal    CBC nl   Urinalysis neg      Assessment/Plan    ICD-10-CM ICD-9-CM    1. Routine general medical examination at a health care facility Z00.00 V70.0 LIPID PANEL      PSA DIAGNOSTIC (PROSTATIC SPECIFIC AG)      PR COLLECTION VENOUS BLOOD,VENIPUNCTURE      AMB POC COMPLETE CBC,AUTOMATED ENTER      AMB POC URINALYSIS DIP STICK AUTO Jason Shah/O MICRO      METABOLIC PANEL, COMPREHENSIVE   2. Encounter for immunization Z23 V03.89 PNEUMOCOCCAL CONJ VACCINE 13 VALENT IM     CAD and Bicuspid AV. Clinically stable.   Await lipids   Allergic Rhinitis. Stable   Prevnar 13, discussed and given   Screening colonoscopy in 3 days   Repeat lipids 6 m and cpe in 1 y    Chartered loss adjusterAuthor:  Feliberto Shah. Jason Sectionlifford Hendrix IV, MD 3:09 PM11/30/2016

## 2015-03-21 NOTE — Progress Notes (Signed)
Scheduled appt

## 2015-03-21 NOTE — Patient Instructions (Addendum)
Stop dipping     Prevnar 13  Pneumonia shot given today .     Same meds    Repeat cholesterol in 6 months     Physical in 1 year

## 2015-03-22 LAB — METABOLIC PANEL, COMPREHENSIVE
A-G Ratio: 2.2 (ref 1.1–2.5)
ALT (SGPT): 45 IU/L — ABNORMAL HIGH (ref 0–44)
AST (SGOT): 28 IU/L (ref 0–40)
Albumin: 4.7 g/dL (ref 3.5–5.5)
Alk. phosphatase: 64 IU/L (ref 39–117)
BUN/Creatinine ratio: 24 — ABNORMAL HIGH (ref 9–20)
BUN: 20 mg/dL (ref 6–24)
Bilirubin, total: 0.6 mg/dL (ref 0.0–1.2)
CO2: 24 mmol/L (ref 18–29)
Calcium: 9.4 mg/dL (ref 8.7–10.2)
Chloride: 100 mmol/L (ref 97–106)
Creatinine: 0.85 mg/dL (ref 0.76–1.27)
GFR est AA: 117 mL/min/{1.73_m2} (ref >59)
GFR est non-AA: 102 mL/min/{1.73_m2} (ref >59)
GLOBULIN, TOTAL: 2.1 g/dL (ref 1.5–4.5)
Glucose: 97 mg/dL (ref 65–99)
Potassium: 4.3 mmol/L (ref 3.5–5.2)
Protein, total: 6.8 g/dL (ref 6.0–8.5)
Sodium: 140 mmol/L (ref 136–144)

## 2015-03-22 LAB — LIPID PANEL
Cholesterol, total: 230 mg/dL — ABNORMAL HIGH (ref 100–199)
HDL Cholesterol: 69 mg/dL (ref >39)
LDL, calculated: 139 mg/dL — ABNORMAL HIGH (ref 0–99)
Triglyceride: 109 mg/dL (ref 0–149)
VLDL, calculated: 22 mg/dL (ref 5–40)

## 2015-03-22 LAB — PSA, DIAGNOSTIC (PROSTATE SPECIFIC AG): Prostate Specific Ag: 0.4 ng/mL (ref 0.0–4.0)

## 2015-03-22 NOTE — Progress Notes (Signed)
Gave results by phone. Lipids unexpectedly high . Repeat send out lipids on 03/26/15 (hypercholesterolemia)

## 2015-03-26 ENCOUNTER — Encounter: Attending: Internal Medicine | Primary: Internal Medicine

## 2015-03-26 ENCOUNTER — Ambulatory Visit
Admit: 2015-03-26 | Discharge: 2015-03-26 | Payer: PRIVATE HEALTH INSURANCE | Attending: Internal Medicine | Primary: Internal Medicine

## 2015-03-26 DIAGNOSIS — E7800 Pure hypercholesterolemia, unspecified: Secondary | ICD-10-CM

## 2015-03-27 LAB — LIPID PANEL
Cholesterol, total: 208 mg/dL — ABNORMAL HIGH (ref 100–199)
HDL Cholesterol: 62 mg/dL (ref >39)
LDL, calculated: 120 mg/dL — ABNORMAL HIGH (ref 0–99)
Triglyceride: 130 mg/dL (ref 0–149)
VLDL, calculated: 26 mg/dL (ref 5–40)

## 2015-03-28 NOTE — Telephone Encounter (Signed)
pc . Gave results. ERX Crestor in place of Lipitor   Rpt lipids, AST in 6 wks

## 2015-03-29 MED ORDER — ROSUVASTATIN 40 MG TAB
40 mg | ORAL_TABLET | Freq: Every evening | ORAL | 12 refills | Status: DC
Start: 2015-03-29 — End: 2016-03-31

## 2015-08-29 ENCOUNTER — Encounter

## 2015-09-06 ENCOUNTER — Ambulatory Visit: Payer: PRIVATE HEALTH INSURANCE | Primary: Internal Medicine

## 2015-09-07 ENCOUNTER — Inpatient Hospital Stay
Admit: 2015-09-07 | Discharge: 2015-09-07 | Payer: PRIVATE HEALTH INSURANCE | Attending: Orthopaedic Surgery | Primary: Internal Medicine

## 2015-09-07 DIAGNOSIS — S83207A Unspecified tear of unspecified meniscus, current injury, left knee, initial encounter: Secondary | ICD-10-CM

## 2016-03-24 ENCOUNTER — Ambulatory Visit: Admit: 2016-03-24 | Payer: PRIVATE HEALTH INSURANCE | Attending: Internal Medicine | Primary: Internal Medicine

## 2016-03-24 DIAGNOSIS — Z0000 Encounter for general adult medical examination without abnormal findings: Secondary | ICD-10-CM

## 2016-03-24 MED ORDER — METRONIDAZOLE 500 MG TAB
500 mg | ORAL_TABLET | Freq: Three times a day (TID) | ORAL | 0 refills | Status: DC
Start: 2016-03-24 — End: 2016-03-24

## 2016-03-24 NOTE — Progress Notes (Signed)
Jason Shah is a 50 y.o. male    HPI    Routine Physical     Past Medical History:   Diagnosis Date   ??? 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:   LAD obstruction   ; tx: 2 stents. Also has obstruction  in  2nd marginal of the LCX  Proximal aortic root dilatation.  Several days after cath: Echo: EF 49 %. [name].     ??? Allergic rhinitis    ??? Bicuspid aortic valve 08/01/2013   ??? Bronchitis 12/30/2010   ??? CAD (coronary artery disease)    ??? Depression 12/30/2010   ??? Eczema 12/30/2010   ??? Hypercholesterolemia 06/24/2011   ??? Pneumonia     several episodes in childhood.      Past Surgical History:   Procedure Laterality Date   ??? CABG, ARTERY-VEIN, SINGLE  08-26-13    aortic root replacement at [facility]   ??? HX OTHER SURGICAL      removal of cysts   ??? HX UROLOGICAL      vasectomy; vasectomy reversal .     Current Outpatient Prescriptions   Medication Sig Dispense Refill   ??? rosuvastatin (CRESTOR) 40 mg tablet Take 1 Tab by mouth nightly. 30 Tab 12   ??? Cetirizine (ZYRTEC) 10 mg cap Take  by mouth. Daily     ??? fluticasone (FLONASE) 50 mcg/actuation nasal spray by Both Nostrils route nightly.     ??? albuterol (PROAIR HFA) 90 mcg/actuation inhaler Take 2 Puffs by inhalation every four (4) hours as needed for Wheezing.     ??? aspirin 81 mg chewable tablet Take 162 mg by mouth daily.     ??? acetaminophen (TYLENOL) 325 mg tablet Take  by mouth every four (4) hours as needed for Pain.     ??? multivitamin, stress formula (STRESS TAB) tablet Take 1 Tab by mouth daily.     ??? naproxen sodium (ALEVE) 220 mg Cap Take  by mouth.         No Known Allergies  Family History   Problem Relation Age of Onset   ??? Heart Disease Father    ??? Asthma Mother    ??? Asthma Brother      History   Smoking Status   ??? Never Smoker   Smokeless Tobacco   ??? Current User       Review of Systems  Denies dyspnea.  Cardiac:  Denies exertional chest pain. Occasionally palpitations at rest typically, and last 3 to 4 minutes.     GI: Bowels move regularly without hematochezia.[name].  Urination without difficulty  Left knee arthroscopy meniscectomy in Sept 2017 , Dr Jeanella Carahorp Davis.   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   Wt gain #12 in past  Number of months; he reports no change in diet . Avoids snacking   ETOH same . Crestor  Cramps.   Cold recently with minor  chest congestion . Feels flushed.   Few mos ago left post cervical pain episode when he had diffuse back pain     Physical Examination:  Visit Vitals   ??? BP (!) 168/100   ??? Temp 98.3 ??F (36.8 ??C)   ??? Ht 5' 9.5" (1.765 m)   ??? Wt 225 lb (102.1 kg)   ??? BMI 32.75 kg/m2     Physical Exam  General:   Appears in no acute distress.  HEENT:   Negative                   Lungs:   Clear        Heart:  Regular without murmur, gallop or rub       Abdomen:   Benign exam without organomegaly or mass palpable                Rectal:     Extremities: No edema   Neurologic: Grossly nonfocal      Results for orders placed or performed in visit on 03/26/15   LIPID PANEL   Result Value Ref Range    Cholesterol, total 208 (H) 100 - 199 mg/dL    Triglyceride 629130 0 - 149 mg/dL    HDL Cholesterol 62 >52>39 mg/dL    VLDL, calculated 26 5 - 40 mg/dL    LDL, calculated 841120 (H) 0 - 99 mg/dL     Assessment/Plan    ICD-10-CM ICD-9-CM    1. Routine general medical examination at a health care facility Z00.00 V70.0 LIPID PANEL      URINALYSIS [name]/ RFLX MICROSCOPIC      METABOLIC PANEL, COMPREHENSIVE      PR COLLECTION VENOUS BLOOD,VENIPUNCTURE      CBC WITH AUTOMATED DIFF      PSA, DIAGNOSTIC (PROSTATE SPECIFIC AG)     URI with flushing . Follow   Rpt bp soon with Cardiology in few weeks   Palpitations of unclear significance.   Await lipids  Probable neck strain. F/u prn   Rpt lipids in 6 months       Author:  Ysidro EvertW. Clifford Hendrix IV, MD 3:06 PM12/07/2015

## 2016-03-24 NOTE — Patient Instructions (Signed)
Call in 2 days for cholesterol results.   Return in 6 months to recheck cholesterol

## 2016-03-25 LAB — CBC WITH AUTOMATED DIFF
ABS. BASOPHILS: 0 10*3/uL (ref 0.0–0.2)
ABS. EOSINOPHILS: 0.2 10*3/uL (ref 0.0–0.4)
ABS. IMM. GRANS.: 0 10*3/uL (ref 0.0–0.1)
ABS. MONOCYTES: 0.4 10*3/uL (ref 0.1–0.9)
ABS. NEUTROPHILS: 3.9 10*3/uL (ref 1.4–7.0)
Abs Lymphocytes: 2 10*3/uL (ref 0.7–3.1)
BASOPHILS: 0 %
EOSINOPHILS: 3 %
HCT: 47.8 % (ref 37.5–51.0)
HGB: 16.3 g/dL (ref 13.0–17.7)
IMMATURE GRANULOCYTES: 0 %
Lymphocytes: 30 %
MCH: 29.9 pg (ref 26.6–33.0)
MCHC: 34.1 g/dL (ref 31.5–35.7)
MCV: 88 fL (ref 79–97)
MONOCYTES: 7 %
NEUTROPHILS: 60 %
PLATELET: 202 10*3/uL (ref 150–379)
RBC: 5.46 x10E6/uL (ref 4.14–5.80)
RDW: 14.1 % (ref 12.3–15.4)
WBC: 6.5 10*3/uL (ref 3.4–10.8)

## 2016-03-25 LAB — URINALYSIS W/ RFLX MICROSCOPIC
Bilirubin: NEGATIVE
Blood: NEGATIVE
Glucose: NEGATIVE
Ketone: NEGATIVE
Leukocyte Esterase: NEGATIVE
Nitrites: NEGATIVE
Protein: NEGATIVE
Specific Gravity: 1.024 (ref 1.005–1.030)
Urobilinogen: 0.2 mg/dL (ref 0.2–1.0)
pH (UA): 6 (ref 5.0–7.5)

## 2016-03-25 LAB — LIPID PANEL
Cholesterol, total: 203 mg/dL — ABNORMAL HIGH (ref 100–199)
HDL Cholesterol: 67 mg/dL (ref >39)
LDL, calculated: 113 mg/dL — ABNORMAL HIGH (ref 0–99)
Triglyceride: 117 mg/dL (ref 0–149)
VLDL, calculated: 23 mg/dL (ref 5–40)

## 2016-03-25 LAB — METABOLIC PANEL, COMPREHENSIVE
A-G Ratio: 2.3 — ABNORMAL HIGH (ref 1.2–2.2)
ALT (SGPT): 40 IU/L (ref 0–44)
AST (SGOT): 29 IU/L (ref 0–40)
Albumin: 4.8 g/dL (ref 3.5–5.5)
Alk. phosphatase: 70 IU/L (ref 39–117)
BUN/Creatinine ratio: 16 (ref 9–20)
BUN: 18 mg/dL (ref 6–24)
Bilirubin, total: 0.5 mg/dL (ref 0.0–1.2)
CO2: 26 mmol/L (ref 18–29)
Calcium: 9.4 mg/dL (ref 8.7–10.2)
Chloride: 100 mmol/L (ref 96–106)
Creatinine: 1.1 mg/dL (ref 0.76–1.27)
GFR est AA: 89 mL/min/{1.73_m2} (ref >59)
GFR est non-AA: 77 mL/min/{1.73_m2} (ref >59)
GLOBULIN, TOTAL: 2.1 g/dL (ref 1.5–4.5)
Glucose: 100 mg/dL — ABNORMAL HIGH (ref 65–99)
Potassium: 4.5 mmol/L (ref 3.5–5.2)
Protein, total: 6.9 g/dL (ref 6.0–8.5)
Sodium: 142 mmol/L (ref 134–144)

## 2016-03-25 LAB — PSA, DIAGNOSTIC (PROSTATE SPECIFIC AG): Prostate Specific Ag: 0.4 ng/mL (ref 0.0–4.0)

## 2016-03-31 MED ORDER — ATORVASTATIN 80 MG TAB
80 mg | ORAL_TABLET | Freq: Every day | ORAL | 11 refills | Status: AC
Start: 2016-03-31 — End: ?

## 2016-03-31 NOTE — Telephone Encounter (Signed)
L/M results. Stop Crestor and start [facility] . F/u lipids fasting and OV in 6 weeks.  F/u sooner if flushing persists. I advised restricting calories to help lose weight.

## 2016-09-23 ENCOUNTER — Encounter: Attending: Internal Medicine | Primary: Internal Medicine

## 2016-11-18 ENCOUNTER — Ambulatory Visit: Admit: 2016-11-18 | Payer: PRIVATE HEALTH INSURANCE | Attending: Internal Medicine | Primary: Internal Medicine

## 2016-11-18 DIAGNOSIS — M545 Low back pain, unspecified: Secondary | ICD-10-CM

## 2016-11-18 MED ORDER — CYCLOBENZAPRINE 10 MG TAB
10 mg | ORAL_TABLET | Freq: Three times a day (TID) | ORAL | 1 refills | Status: DC
Start: 2016-11-18 — End: 2016-11-18

## 2016-11-18 MED ORDER — CYCLOBENZAPRINE 10 MG TAB
10 mg | ORAL_TABLET | Freq: Three times a day (TID) | ORAL | 1 refills | Status: DC
Start: 2016-11-18 — End: 2018-02-09

## 2016-11-18 NOTE — Progress Notes (Signed)
HPI:non radiating right lumbago on advil request muscle relaxant    Physical Examination:  Visit Vitals   ??? BP 120/80   ??? Ht 5' 9.5" (1.765 m)   ??? Wt 225 lb (102.1 kg)   ??? BMI 32.75 kg/m2     General:  HEENT:  NECK:  Lungs:  Heart:  Breasts:  Abdomen:  Rectal:  Extremities:gait ok  Neurologic:    Prior to Admission medications    Medication Sig Start Date End Date Taking? Authorizing Provider   cyclobenzaprine (FLEXERIL) 10 mg tablet Take 1 Tab by mouth three (3) times daily (with meals). 11/18/16  Yes Cresta Riden Lesly Dukes, MD   atorvastatin (LIPITOR) 80 mg tablet Take 1 Tab by mouth daily. 03/31/16   W. Trinna Balloon IV, MD   Cetirizine (ZYRTEC) 10 mg cap Take  by mouth. Daily    Historical Provider   fluticasone (FLONASE) 50 mcg/actuation nasal spray by Both Nostrils route nightly.    Historical Provider   albuterol (PROAIR HFA) 90 mcg/actuation inhaler Take 2 Puffs by inhalation every four (4) hours as needed for Wheezing.    Historical Provider   aspirin 81 mg chewable tablet Take 162 mg by mouth daily.    Historical Provider   acetaminophen (TYLENOL) 325 mg tablet Take  by mouth every four (4) hours as needed for Pain.    Historical Provider   multivitamin, stress formula (STRESS TAB) tablet Take 1 Tab by mouth daily.    Historical Provider   naproxen sodium (ALEVE) 220 mg Cap Take  by mouth.      Historical Provider     No Known Allergies    RESULTS:  Results for orders placed or performed in visit on 03/24/16   LIPID PANEL   Result Value Ref Range    Cholesterol, total 203 (H) 100 - 199 mg/dL    Triglyceride 117 0 - 149 mg/dL    HDL Cholesterol 67 >39 mg/dL    VLDL, calculated 23 5 - 40 mg/dL    LDL, calculated 113 (H) 0 - 99 mg/dL   URINALYSIS W/ RFLX MICROSCOPIC   Result Value Ref Range    Specific Gravity 1.024 1.005 - 1.030    pH (UA) 6.0 5.0 - 7.5    Color Yellow Yellow    Appearance Clear Clear    Leukocyte Esterase Negative Negative    Protein Negative Negative/Trace    Glucose Negative Negative     Ketone Negative Negative    Blood Negative Negative    Bilirubin Negative Negative    Urobilinogen 0.2 0.2 - 1.0 mg/dL    Nitrites Negative Negative    Microscopic Examination Comment    METABOLIC PANEL, COMPREHENSIVE   Result Value Ref Range    Glucose 100 (H) 65 - 99 mg/dL    BUN 18 6 - 24 mg/dL    Creatinine 1.10 0.76 - 1.27 mg/dL    GFR est non-AA 77 >59 mL/min/1.73    GFR est AA 89 >59 mL/min/1.73    BUN/Creatinine ratio 16 9 - 20    Sodium 142 134 - 144 mmol/L    Potassium 4.5 3.5 - 5.2 mmol/L    Chloride 100 96 - 106 mmol/L    CO2 26 18 - 29 mmol/L    Calcium 9.4 8.7 - 10.2 mg/dL    Protein, total 6.9 6.0 - 8.5 g/dL    Albumin 4.8 3.5 - 5.5 g/dL    GLOBULIN, TOTAL 2.1 1.5 - 4.5 g/dL  A-G Ratio 2.3 (H) 1.2 - 2.2    Bilirubin, total 0.5 0.0 - 1.2 mg/dL    Alk. phosphatase 70 39 - 117 IU/L    AST (SGOT) 29 0 - 40 IU/L    ALT (SGPT) 40 0 - 44 IU/L   CBC WITH AUTOMATED DIFF   Result Value Ref Range    WBC 6.5 3.4 - 10.8 x10E3/uL    RBC 5.46 4.14 - 5.80 x10E6/uL    HGB 16.3 13.0 - 17.7 g/dL    HCT 47.8 37.5 - 51.0 %    MCV 88 79 - 97 fL    MCH 29.9 26.6 - 33.0 pg    MCHC 34.1 31.5 - 35.7 g/dL    RDW 14.1 12.3 - 15.4 %    PLATELET 202 150 - 379 x10E3/uL    NEUTROPHILS 60 Not Estab. %    Lymphocytes 30 Not Estab. %    MONOCYTES 7 Not Estab. %    EOSINOPHILS 3 Not Estab. %    BASOPHILS 0 Not Estab. %    ABS. NEUTROPHILS 3.9 1.4 - 7.0 x10E3/uL    Abs Lymphocytes 2.0 0.7 - 3.1 x10E3/uL    ABS. MONOCYTES 0.4 0.1 - 0.9 x10E3/uL    ABS. EOSINOPHILS 0.2 0.0 - 0.4 x10E3/uL    ABS. BASOPHILS 0.0 0.0 - 0.2 x10E3/uL    IMMATURE GRANULOCYTES 0 Not Estab. %    ABS. IMM. GRANS. 0.0 0.0 - 0.1 x10E3/uL   PSA, DIAGNOSTIC (PROSTATE SPECIFIC AG)   Result Value Ref Range    Prostate Specific Ag 0.4 0.0 - 4.0 ng/mL       Assessment/Plan:    ICD-10-CM ICD-9-CM    1. Acute right-sided low back pain without sciatica M54.5 724.2    flexeril    Follow Up:    Author: Kandis Nab, MD 3:04 PM 11/18/2016

## 2018-02-09 ENCOUNTER — Ambulatory Visit: Attending: Internal Medicine | Primary: Internal Medicine

## 2018-02-09 ENCOUNTER — Ambulatory Visit: Admit: 2018-02-09 | Attending: Internal Medicine | Primary: Internal Medicine

## 2018-02-09 DIAGNOSIS — Z0000 Encounter for general adult medical examination without abnormal findings: Secondary | ICD-10-CM

## 2018-02-09 MED ORDER — BUSPIRONE 5 MG TAB
5 mg | ORAL_TABLET | Freq: Every day | ORAL | 0 refills | Status: DC | PRN
Start: 2018-02-09 — End: 2018-07-05

## 2018-02-09 MED ORDER — ALBUTEROL SULFATE HFA 90 MCG/ACTUATION AEROSOL INHALER
90 mcg/actuation | RESPIRATORY_TRACT | 11 refills | Status: DC | PRN
Start: 2018-02-09 — End: 2020-03-21

## 2018-02-09 NOTE — Progress Notes (Signed)
Phoned results. Discussed and added Zetia. Advised wt loss and ADA diet. F/u lipids , CMP in 6 weeks. Cc: labs to Dr. Chris  Thomas

## 2018-02-09 NOTE — Patient Instructions (Signed)
Albuterol as needed   Buspirone as needed   Stop dipping   When well, take 2 part SHingles shot . Premedicate before hand with Tylenol 650 mg   Avoid Aleve and Ibuprofen   Return in 1 year or sooner as needed

## 2018-02-09 NOTE — Progress Notes (Signed)
Jason Shah is a 52 y.o. Jason Shah/m CC: Routine Physical     HPI     Routine Physical     Past Medical History:   Diagnosis Date   ??? 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 In [place]. Cath:   LAD obstruction   ; tx: 2 stents. Also has obstruction  in  2nd marginal of the LCX  Proximal aortic root dilatation.  Several days after cath: Echo: EF 49 %. [name]     ??? Allergic rhinitis    ??? Bicuspid aortic valve 08/01/2013   ??? Bronchitis 12/30/2010   ??? CAD (coronary artery disease)    ??? Depression 12/30/2010   ??? Eczema 12/30/2010   ??? Hypercholesterolemia 06/24/2011   ??? Pneumonia     several episodes in childhood.      Past Surgical History:   Procedure Laterality Date   ??? HX COLONOSCOPY      age 89 . results negative . f/u 10 years    ??? HX HEART CATHETERIZATION      Bicuspid Aortic Valve repair. Thoracic Aortic Aneursym repair 2015 [facility]    ??? HX ORTHOPAEDIC  11/2015    Left Knee arthroscopic Meniscectomy    ??? HX OTHER SURGICAL      removal of cysts   ??? HX UROLOGICAL      vasectomy; vasectomy reversal .     Current Outpatient Medications   Medication Sig Dispense Refill   ??? albuterol (PROVENTIL HFA, VENTOLIN HFA, PROAIR HFA) 90 mcg/actuation inhaler Take 2 Puffs by inhalation every four (4) hours as needed for Wheezing. 1 Inhaler 11   ??? busPIRone (BUSPAR) 5 mg tablet Take 1 Tab by mouth daily as needed (anxiety). 20 Tab 0   ??? atorvastatin (LIPITOR) 80 mg tablet Take 1 Tab by mouth daily. 30 Tab 11   ??? Cetirizine (ZYRTEC) 10 mg cap Take  by mouth. Daily     ??? albuterol (PROAIR HFA) 90 mcg/actuation inhaler Take 2 Puffs by inhalation every four (4) hours as needed for Wheezing.     ??? aspirin 81 mg chewable tablet Take 162 mg by mouth daily.     ??? acetaminophen (TYLENOL) 325 mg tablet Take  by mouth every four (4) hours as needed for Pain.     ??? naproxen sodium (ALEVE) 220 mg Cap Take  by mouth.         No Known Allergies  Family History   Problem Relation Age of Onset   ??? Heart Disease Father    ??? Asthma Mother     ??? Asthma Brother      Social History     Tobacco Use   Smoking Status Never Smoker   Smokeless Tobacco Current User       Review of Systems  Denies dyspnea.  Cardiac:  Denies chest pain  Rare palpitations. Denies exertional chest pain . In 3 d , routine Cardiology   appt with Cardiology Dr. Carren Rang  GI: Bowels move regularly without hematochezia.. Years , infrequent fleeting LLQ abd pain  Urination. Gradually over years stream slower . Approximately, 2 1/2 wk ago had acute URI, which transitioned into mild Acute Asthmatic Bronchitis , tx  Mucinex. Overall, feeling better from it.   Still able to exercise. .   Left knee feels ok   Mild Eczema   Clonazepma helped prn anixety in past .   Passes Depression scrrn       Physical Examination:  Visit Vitals  BP 134/84   Temp 99.1 ??F (37.3 ??C)   Wt 219 lb (99.3 kg)   SpO2 97%   BMI 31.88 kg/m??     Physical Exam  General:   Appears in no acute distress.   HEENT:   Negative                   Lungs:   Clear        Heart:  Regular without murmur, gallop or rub       Abdomen:   Benign exam without organomegaly or mass palpable    GU : nl genitalia Jason Shah/o hernia            Rectal:  smooth prostate. No rectal mass felt.    Extremities: No edema. Left knee : no swelling f/e intact.   Non tender. Left knee stable   Neurologic: Grossly nonfocal      No results found for this or any previous visit (from the past 8 hour(s)).                Assessment/Plan    ICD-10-CM ICD-9-CM    1. Routine general medical examination at a health care facility Z00.00 V70.0 LIPID PANEL      CBC WITH AUTOMATED DIFF      URINALYSIS Jason Shah/ RFLX MICROSCOPIC      METABOLIC PANEL, COMPREHENSIVE      PSA, DIAGNOSTIC (PROSTATE SPECIFIC AG)      COLLECTION VENOUS BLOOD,VENIPUNCTURE     Probable Viral Asthmatic Bronchitis , better. [name] 'd Albuterol MDI prn     CAD appears clinically stable. F/u Dr. Maisie Fus     Lipids pending.     Situational anxiety. Discussed, prescribed Buspar every day prn      Suspect LLQ intestinal or muscle spasm. Follow.     Recovered well from Left knee arthroscopy 2 y ago. Advised avoiding regular NSAID use due to hx CAD. Tylenol ok      Obesity. P: gradual wt loss via diet and exercise.     Stop dipping advised.  Discussed, prescribed Shingrix.    RV 1 y or prn       Author:  Ysidro Evert, MD 10:24 AM10/22/2019

## 2018-02-09 NOTE — Progress Notes (Signed)
Phoned results. Discussed and added Zetia. Advised wt loss and ADA diet. F/u lipids , CMP in 6 weeks. Cc: labs to Dr. Carren Rang

## 2018-02-09 NOTE — Progress Notes (Signed)
Jason Shah is a 52 y.o. Jason Shah/m CC: Routine Physical     HPI     Routine Physical     Past Medical History:   Diagnosis Date   ??? 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 In [place]. Cath:   LAD obstruction   ; tx: 2 stents. Also has obstruction  in  2nd marginal of the LCX  Proximal aortic root dilatation.  Several days after cath: Echo: EF 49 %. [name]     ??? Allergic rhinitis    ??? Bicuspid aortic valve 08/01/2013   ??? Bronchitis 12/30/2010   ??? CAD (coronary artery disease)    ??? Depression 12/30/2010   ??? Eczema 12/30/2010   ??? Hypercholesterolemia 06/24/2011   ??? Pneumonia     several episodes in childhood.      Past Surgical History:   Procedure Laterality Date   ??? HX COLONOSCOPY      age 31 . results negative . f/u 10 years    ??? HX HEART CATHETERIZATION      Bicuspid Aortic Valve repair. Thoracic Aortic Aneursym repair 2015 [facility]    ??? HX ORTHOPAEDIC  11/2015    Left Knee arthroscopic Meniscectomy    ??? HX OTHER SURGICAL      removal of cysts   ??? HX UROLOGICAL      vasectomy; vasectomy reversal .     Current Outpatient Medications   Medication Sig Dispense Refill   ??? albuterol (PROVENTIL HFA, VENTOLIN HFA, PROAIR HFA) 90 mcg/actuation inhaler Take 2 Puffs by inhalation every four (4) hours as needed for Wheezing. 1 Inhaler 11   ??? busPIRone (BUSPAR) 5 mg tablet Take 1 Tab by mouth daily as needed (anxiety). 20 Tab 0   ??? atorvastatin (LIPITOR) 80 mg tablet Take 1 Tab by mouth daily. 30 Tab 11   ??? Cetirizine (ZYRTEC) 10 mg cap Take  by mouth. Daily     ??? albuterol (PROAIR HFA) 90 mcg/actuation inhaler Take 2 Puffs by inhalation every four (4) hours as needed for Wheezing.     ??? aspirin 81 mg chewable tablet Take 162 mg by mouth daily.     ??? acetaminophen (TYLENOL) 325 mg tablet Take  by mouth every four (4) hours as needed for Pain.     ??? naproxen sodium (ALEVE) 220 mg Cap Take  by mouth.         No Known Allergies  Family History   Problem Relation Age of Onset   ??? Heart Disease Father    ??? Asthma Mother    ???  Asthma Brother      Social History     Tobacco Use   Smoking Status Never Smoker   Smokeless Tobacco Current User       Review of Systems  Denies dyspnea.  Cardiac:  Denies chest pain  Rare palpitations. Denies exertional chest pain . In 3 d , routine Cardiology   appt with Cardiology Jason Shah  GI: Bowels move regularly without hematochezia.. Years , infrequent fleeting LLQ abd pain  Urination. Gradually over years stream slower . Approximately, 2 1/2 wk ago had acute URI, which transitioned into mild Acute Asthmatic Bronchitis , tx  Mucinex. Overall, feeling better from it.   Still able to exercise. .   Left knee feels ok   Mild Eczema   Clonazepma helped prn anixety in past .   Passes Depression scrrn       Physical Examination:  Visit Vitals  BP 134/84   Temp 99.1 ??F (37.3 ??C)   Wt 219 lb (99.3 kg)   SpO2 97%   BMI 31.88 kg/m??     Physical Exam  General:   Appears in no acute distress.   HEENT:   Negative                   Lungs:   Clear        Heart:  Regular without murmur, gallop or rub       Abdomen:   Benign exam without organomegaly or mass palpable    GU : nl genitalia Elsworth Ledin/o hernia            Rectal:  smooth prostate. No rectal mass felt.    Extremities: No edema. Left knee : no swelling f/e intact.   Non tender. Left knee stable   Neurologic: Grossly nonfocal      No results found for this or any previous visit (from the past 8 hour(s)).                Assessment/Plan    ICD-10-CM ICD-9-CM    1. Routine general medical examination at a health care facility Z00.00 V70.0 LIPID PANEL      CBC WITH AUTOMATED DIFF      URINALYSIS Allysson Rinehimer/ RFLX MICROSCOPIC      METABOLIC PANEL, COMPREHENSIVE      PSA, DIAGNOSTIC (PROSTATE SPECIFIC AG)      COLLECTION VENOUS BLOOD,VENIPUNCTURE     Probable Viral Asthmatic Bronchitis , better. [name] 'd Albuterol MDI prn     CAD appears clinically stable. F/u Dr. Maisie Fus     Lipids pending.     Situational anxiety. Discussed, prescribed Buspar every day prn     Suspect LLQ intestinal  or muscle spasm. Follow.     Recovered well from Left knee arthroscopy 2 y ago. Advised avoiding regular NSAID use due to hx CAD. Tylenol ok      Obesity. P: gradual wt loss via diet and exercise.     Stop dipping advised.  Discussed, prescribed Shingrix.    RV 1 y or prn       Author:  Ysidro Evert, MD 10:24 AM10/22/2019

## 2018-02-10 LAB — COMPREHENSIVE METABOLIC PANEL
ALT: 25 IU/L (ref 0–44)
AST: 22 IU/L (ref 0–40)
Albumin/Globulin Ratio: 2.1 NA (ref 1.2–2.2)
Albumin: 4.8 g/dL (ref 3.5–5.5)
Alkaline Phosphatase: 71 IU/L (ref 39–117)
BUN: 23 mg/dL (ref 6–24)
Bun/Cre Ratio: 19 NA (ref 9–20)
CO2: 26 mmol/L (ref 20–29)
Calcium: 9.4 mg/dL (ref 8.7–10.2)
Chloride: 103 mmol/L (ref 96–106)
Creatinine: 1.19 mg/dL (ref 0.76–1.27)
EGFR IF NonAfrican American: 69 mL/min/{1.73_m2} (ref >59)
GFR African American: 80 mL/min/{1.73_m2} (ref >59)
Globulin, Total: 2.3 g/dL (ref 1.5–4.5)
Glucose: 122 mg/dL — ABNORMAL HIGH (ref 65–99)
Potassium: 5.1 mmol/L (ref 3.5–5.2)
Sodium: 144 mmol/L (ref 134–144)
Total Bilirubin: 0.4 mg/dL (ref 0.0–1.2)
Total Protein: 7.1 g/dL (ref 6.0–8.5)

## 2018-02-10 LAB — CBC WITH AUTO DIFFERENTIAL
Basophils %: 1 %
Basophils Absolute: 0.1 10*3/uL (ref 0.0–0.2)
Eosinophils %: 7 %
Eosinophils Absolute: 0.3 10*3/uL (ref 0.0–0.4)
Granulocyte Absolute Count: 0 10*3/uL (ref 0.0–0.1)
Hematocrit: 44.4 % (ref 37.5–51.0)
Hemoglobin: 16.2 g/dL (ref 13.0–17.7)
Immature Granulocytes: 1 %
Lymphocytes %: 35 %
Lymphocytes Absolute: 1.8 10*3/uL (ref 0.7–3.1)
MCH: 32.1 pg (ref 26.6–33.0)
MCHC: 36.5 g/dL — ABNORMAL HIGH (ref 31.5–35.7)
MCV: 88 fL (ref 79–97)
Monocytes %: 10 %
Monocytes Absolute: 0.5 10*3/uL (ref 0.1–0.9)
Neutrophils %: 46 %
Neutrophils Absolute: 2.3 10*3/uL (ref 1.4–7.0)
Platelets: 202 10*3/uL (ref 150–450)
RBC: 5.04 x10E6/uL (ref 4.14–5.80)
RDW: 12.3 % (ref 12.3–15.4)
WBC: 5 10*3/uL (ref 3.4–10.8)

## 2018-02-10 LAB — LIPID PANEL
Cholesterol, Total: 254 mg/dL — ABNORMAL HIGH (ref 100–199)
Cholesterol, total: 254 mg/dL — ABNORMAL HIGH (ref 100–199)
HDL Cholesterol: 61 mg/dL (ref >39)
HDL: 61 mg/dL (ref >39)
LDL Calculated: 171 mg/dL — ABNORMAL HIGH (ref 0–99)
LDL, calculated: 171 mg/dL — ABNORMAL HIGH (ref 0–99)
Triglyceride: 112 mg/dL (ref 0–149)
Triglycerides: 112 mg/dL (ref 0–149)
VLDL Cholesterol Calculated: 22 mg/dL (ref 5–40)
VLDL, calculated: 22 mg/dL (ref 5–40)

## 2018-02-10 LAB — URINALYSIS W/ RFLX MICROSCOPIC
Bilirubin, Urine: NEGATIVE
Bilirubin: NEGATIVE
Blood, Urine: NEGATIVE
Blood: NEGATIVE
Glucose, UA: NEGATIVE
Glucose: NEGATIVE
Ketone: NEGATIVE
Ketones, Urine: NEGATIVE
Leukocyte Esterase, Urine: NEGATIVE
Leukocyte Esterase: NEGATIVE
Nitrite, Urine: NEGATIVE
Nitrites: NEGATIVE
Protein, UA: NEGATIVE
Protein: NEGATIVE
Specific Gravity, UA: 1.02 NA (ref 1.005–1.030)
Specific Gravity: 1.02 (ref 1.005–1.030)
Urobilinogen, Urine: 0.2 mg/dL (ref 0.2–1.0)
Urobilinogen: 0.2 mg/dL (ref 0.2–1.0)
pH (UA): 6 (ref 5.0–7.5)
pH, UA: 6 NA (ref 5.0–7.5)

## 2018-02-10 LAB — PSA PROSTATIC SPECIFIC ANTIGEN: PSA: 0.3 ng/mL (ref 0.0–4.0)

## 2018-02-10 LAB — CBC WITH AUTOMATED DIFF
ABS. BASOPHILS: 0.1 10*3/uL (ref 0.0–0.2)
ABS. EOSINOPHILS: 0.3 10*3/uL (ref 0.0–0.4)
ABS. IMM. GRANS.: 0 10*3/uL (ref 0.0–0.1)
ABS. MONOCYTES: 0.5 10*3/uL (ref 0.1–0.9)
ABS. NEUTROPHILS: 2.3 10*3/uL (ref 1.4–7.0)
Abs Lymphocytes: 1.8 10*3/uL (ref 0.7–3.1)
BASOPHILS: 1 %
EOSINOPHILS: 7 %
HCT: 44.4 % (ref 37.5–51.0)
HGB: 16.2 g/dL (ref 13.0–17.7)
IMMATURE GRANULOCYTES: 1 %
Lymphocytes: 35 %
MCH: 32.1 pg (ref 26.6–33.0)
MCHC: 36.5 g/dL — ABNORMAL HIGH (ref 31.5–35.7)
MCV: 88 fL (ref 79–97)
MONOCYTES: 10 %
NEUTROPHILS: 46 %
PLATELET: 202 10*3/uL (ref 150–450)
RBC: 5.04 x10E6/uL (ref 4.14–5.80)
RDW: 12.3 % (ref 12.3–15.4)
WBC: 5 10*3/uL (ref 3.4–10.8)

## 2018-02-10 LAB — METABOLIC PANEL, COMPREHENSIVE
A-G Ratio: 2.1 (ref 1.2–2.2)
ALT (SGPT): 25 IU/L (ref 0–44)
AST (SGOT): 22 IU/L (ref 0–40)
Albumin: 4.8 g/dL (ref 3.5–5.5)
Alk. phosphatase: 71 IU/L (ref 39–117)
BUN/Creatinine ratio: 19 (ref 9–20)
BUN: 23 mg/dL (ref 6–24)
Bilirubin, total: 0.4 mg/dL (ref 0.0–1.2)
CO2: 26 mmol/L (ref 20–29)
Calcium: 9.4 mg/dL (ref 8.7–10.2)
Chloride: 103 mmol/L (ref 96–106)
Creatinine: 1.19 mg/dL (ref 0.76–1.27)
GFR est AA: 80 mL/min/{1.73_m2} (ref >59)
GFR est non-AA: 69 mL/min/{1.73_m2} (ref >59)
GLOBULIN, TOTAL: 2.3 g/dL (ref 1.5–4.5)
Glucose: 122 mg/dL — ABNORMAL HIGH (ref 65–99)
Potassium: 5.1 mmol/L (ref 3.5–5.2)
Protein, total: 7.1 g/dL (ref 6.0–8.5)
Sodium: 144 mmol/L (ref 134–144)

## 2018-02-10 LAB — PSA, DIAGNOSTIC (PROSTATE SPECIFIC AG): Prostate Specific Ag: 0.3 ng/mL (ref 0.0–4.0)

## 2018-02-10 MED ORDER — EZETIMIBE 10 MG TAB
10 mg | ORAL_TABLET | Freq: Every day | ORAL | 11 refills | Status: DC
Start: 2018-02-10 — End: 2019-02-21

## 2018-02-10 NOTE — Telephone Encounter (Signed)
Phoned him his  lab results. Discussed and prescribed Zetia.   Continue Atorvastatin . Rpt Lipids , CMP in 6 weeks here. Advised wt loss -use ADA diet and continue exercise.

## 2018-04-01 NOTE — Telephone Encounter (Signed)
Called results. Return week after christmas for labs, fasting CMP, Lipids.   Called in Buspar #20 , 1 extra refill. F/u sooner prn

## 2018-04-01 NOTE — Telephone Encounter (Signed)
Patient needs a refill on his Buspar 5 mg 1 daily as needed and lab results please    [facility] ph# [phone]    [facility] ph# [phone]

## 2018-05-26 ENCOUNTER — Ambulatory Visit: Attending: Physician Assistant | Primary: Internal Medicine

## 2018-05-26 ENCOUNTER — Ambulatory Visit: Admit: 2018-05-26 | Attending: Physician Assistant | Primary: Internal Medicine

## 2018-05-26 DIAGNOSIS — J101 Influenza due to other identified influenza virus with other respiratory manifestations: Secondary | ICD-10-CM

## 2018-05-26 LAB — AMB POC COMPLETE CBC,AUTOMATED ENTER
ABS. GRANS (POC): 5.1 10*3/uL (ref 1.4–6.5)
ABS. LYMPHS (POC): 1.5 10*3/uL (ref 1.2–3.4)
ABS. MONOS (POC): 0.1 10*3/uL (ref 0.1–0.6)
GRANULOCYTES (POC): 76.1 % — AB (ref 42.2–75.2)
Granulocytes %, POC: 76.1 % — AB (ref 42.2–75.2)
Granulocytes Abs: 5.1 10*3/uL (ref 1.4–6.5)
HCT (POC): 56.9 % (ref 35.0–60.0)
HGB (POC): 18.7 g/dL — AB (ref 11–18)
Hematocrit, POC: 56.9 % (ref 35.0–60.0)
Hemoglobin, POC: 18.7 g/dL — AB (ref 11–18)
LYMPHOCYTES (POC): 22.1 % (ref 20.5–51.1)
Lymphocyte %: 22.1 % (ref 20.5–51.1)
Lymphs Abs: 1.5 10*3/uL (ref 1.2–3.4)
MCH (POC): 29.9 pg (ref 27.0–31.0)
MCH: 29.9 pg (ref 27.0–31.0)
MCHC (POC): 32.8 g/dL — AB (ref 33.0–37.0)
MCHC: 32.8 g/dL — AB (ref 33.0–37.0)
MCV (POC): 91.1 fL (ref 80.0–99.9)
MCV: 91.1 fL (ref 80.0–99.9)
MONOCYTES (POC): 1.8 % (ref 1.7–9.3)
MPV (POC): 7.9 fL (ref 7.8–11)
MPV POC: 7.9 fL (ref 7.8–11)
Monocyte %: 1.8 % (ref 1.7–9.3)
Monocyte Absolute, POC: 0.1 10*3/uL (ref 0.1–0.6)
PLATELET (POC): 152 10*3/uL (ref 150–450)
Platelet Count, POC: 152 10*3/uL (ref 150–450)
RBC (POC): 6.24 M/uL — AB (ref 4.00–6.00)
RBC, POC: 6.24 M/uL — AB (ref 4.00–6.00)
RDW (POC): 13 % (ref 11.6–13.7)
RDW, POC: 13 % (ref 11.6–13.7)
WBC (POC): 6.7 10*3/uL (ref 4.5–10.5)
WBC, POC: 6.7 10*3/uL (ref 4.5–10.5)

## 2018-05-26 LAB — AMB POC INFLUENZA A  AND B REAL-TIME RT-PCR
Influenza A Ag POC: POSITIVE Pos/Neg
Influenza B Ag POC: NEGATIVE Pos/Neg

## 2018-05-26 MED ORDER — OSELTAMIVIR PHOSPHATE 75 MG CAP
75 mg | ORAL_CAPSULE | Freq: Two times a day (BID) | ORAL | 0 refills | Status: AC
Start: 2018-05-26 — End: 2018-05-31

## 2018-05-26 NOTE — Progress Notes (Signed)
Jason Shah is a 53 y.o. male and presents with   Chief Complaint   Patient presents with   ??? Flu     sore throat began 3 days ago  Extreme fatigue 2 days ago, with muscle ache, wheezing, HA, cough with dark phlegm,  Hot and cold shaking chills, feverish  Has been using inhaler with good relief of wheezing    Denies cp, sob or dyspnea. No NVDC or Urinary sx    Current Outpatient Medications   Medication Sig Dispense Refill   ??? oseltamivir (TAMIFLU) 75 mg capsule Take 1 Cap by mouth two (2) times a day for 5 days. 10 Cap 0   ??? ezetimibe (ZETIA) 10 mg tablet Take 1 Tab by mouth daily. 30 Tab 11   ??? albuterol (PROVENTIL HFA, VENTOLIN HFA, PROAIR HFA) 90 mcg/actuation inhaler Take 2 Puffs by inhalation every four (4) hours as needed for Wheezing. 1 Inhaler 11   ??? busPIRone (BUSPAR) 5 mg tablet Take 1 Tab by mouth daily as needed (anxiety). 20 Tab 0   ??? atorvastatin (LIPITOR) 80 mg tablet Take 1 Tab by mouth daily. 30 Tab 11   ??? Cetirizine (ZYRTEC) 10 mg cap Take  by mouth. Daily     ??? albuterol (PROAIR HFA) 90 mcg/actuation inhaler Take 2 Puffs by inhalation every four (4) hours as needed for Wheezing.     ??? aspirin 81 mg chewable tablet Take 162 mg by mouth daily.     ??? acetaminophen (TYLENOL) 325 mg tablet Take  by mouth every four (4) hours as needed for Pain.     ??? naproxen sodium (ALEVE) 220 mg Cap Take  by mouth.         No Known Allergies  Past Medical History:   Diagnosis Date   ??? 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 In [place]. Cath:   LAD obstruction   ; tx: 2 stents. Also has obstruction  in  2nd marginal of the LCX  Proximal aortic root dilatation.  Several days after cath: Echo: EF 49 %. [name]     ??? Allergic rhinitis    ??? Bicuspid aortic valve 08/01/2013   ??? Bronchitis 12/30/2010   ??? CAD (coronary artery disease)    ??? Depression 12/30/2010   ??? Eczema 12/30/2010   ??? Hypercholesterolemia 06/24/2011   ??? Pneumonia     several episodes in childhood.      Past Surgical History:    Procedure Laterality Date   ??? HX COLONOSCOPY      age 42 . results negative . f/u 10 years    ??? HX HEART CATHETERIZATION      also separately, Bicuspid Aortic Valve repair. Thoracic Aortic Aneursym repair 2015 [facility]    ??? HX ORTHOPAEDIC  11/2015    Left Knee arthroscopic Meniscectomy    ??? HX OTHER SURGICAL      removal of cysts   ??? HX UROLOGICAL      vasectomy; vasectomy reversal .     Family History   Problem Relation Age of Onset   ??? Heart Disease Father    ??? Asthma Mother    ??? Asthma Brother      Social History     Tobacco Use   ??? Smoking status: Never Smoker   ??? Smokeless tobacco: Current User   Substance Use Topics   ??? Alcohol use: Yes     Alcohol/week: 2.0 standard drinks     Types: 2  Glasses of wine per week     Comment: social         ROS: as per HPI otherwise NEG    Objective:  Visit Vitals  BP 104/82   Temp (!) 101.5 ??F (38.6 ??C)   Ht 5' 9.5" (1.765 m)   Wt 216 lb (98 kg)   BMI 31.44 kg/m??       In NAD. Alert, appears ill  Head/Neck -- Supple, no LAD, TM intact, Throat no erythema or exudates, mild maxillary sinus tenderness  Heart -- RRR.  Lungs -- CTA.    Extremities -- No edema          Recent Results (from the past 24 hour(s))   AMB POC INFLUENZA A  AND B REAL-TIME RT-PCR    Collection Time: 05/26/18  3:52 PM   Result Value Ref Range    VALID INTERNAL CONTROL POC Yes     Influenza A Ag POC Positive Negative Pos/Neg    Influenza B Ag POC Negative Negative Pos/Neg   AMB POC COMPLETE CBC,AUTOMATED ENTER    Collection Time: 05/26/18  3:52 PM   Result Value Ref Range    WBC (POC) 6.7 4.5 - 10.5 K/uL    LYMPHOCYTES (POC) 22.1 20.5 - 51.1 %    MONOCYTES (POC) 1.8 1.7 - 9.3 %    GRANULOCYTES (POC) 76.1 (A) 42.2 - 75.2 %    ABS. LYMPHS (POC) 1.5 1.2 - 3.4 K/uL    ABS. MONOS (POC) 0.1 0.1 - 0.6 10^3/ul    ABS. GRANS (POC) 5.1 1.4 - 6.5 10^3/ul    RBC (POC) 6.24 (A) 4.00 - 6.00 M/uL    HGB (POC) 18.7 (A) 11 - 18 g/dL    HCT (POC) 61.9 50.9 - 60.0 %    MCV (POC) 91.1 80.0 - 99.9 fL     MCH (POC) 29.9 27.0 - 31.0 pg    MCHC (POC) 32.8 (A) 33.0 - 37.0 g/dL    RDW (POC) 32.6 71.2 - 13.7 %    PLATELET (POC) 152 150 - 450 K/uL    MPV (POC) 7.9 7.8 - 11 fL       Assessment/Plan:    ICD-10-CM ICD-9-CM    1. Influenza A J10.1 487.1 AMB POC INFLUENZA A  AND B REAL-TIME RT-PCR      AMB POC COMPLETE CBC,AUTOMATED ENTER      oseltamivir (TAMIFLU) 75 mg capsule   2. Cough R05 786.2 AMB POC INFLUENZA A  AND B REAL-TIME RT-PCR      AMB POC COMPLETE CBC,AUTOMATED ENTER       Positive Flu test rev'd  Tamiflu as dir  OTC analgesics   Call if sx worsens/persists        Author:  Brazos Sandoval A Sherrier-Edwards, PA 05/26/2018 3:17 PM

## 2018-05-26 NOTE — Progress Notes (Signed)
Jason Shah is a 53 y.o. male and presents with   Chief Complaint   Patient presents with   ??? Flu     sore throat began 3 days ago  Extreme fatigue 2 days ago, with muscle ache, wheezing, HA, cough with dark phlegm,  Hot and cold shaking chills, feverish  Has been using inhaler with good relief of wheezing    Denies cp, sob or dyspnea. No NVDC or Urinary sx    Current Outpatient Medications   Medication Sig Dispense Refill   ??? oseltamivir (TAMIFLU) 75 mg capsule Take 1 Cap by mouth two (2) times a day for 5 days. 10 Cap 0   ??? ezetimibe (ZETIA) 10 mg tablet Take 1 Tab by mouth daily. 30 Tab 11   ??? albuterol (PROVENTIL HFA, VENTOLIN HFA, PROAIR HFA) 90 mcg/actuation inhaler Take 2 Puffs by inhalation every four (4) hours as needed for Wheezing. 1 Inhaler 11   ??? busPIRone (BUSPAR) 5 mg tablet Take 1 Tab by mouth daily as needed (anxiety). 20 Tab 0   ??? atorvastatin (LIPITOR) 80 mg tablet Take 1 Tab by mouth daily. 30 Tab 11   ??? Cetirizine (ZYRTEC) 10 mg cap Take  by mouth. Daily     ??? albuterol (PROAIR HFA) 90 mcg/actuation inhaler Take 2 Puffs by inhalation every four (4) hours as needed for Wheezing.     ??? aspirin 81 mg chewable tablet Take 162 mg by mouth daily.     ??? acetaminophen (TYLENOL) 325 mg tablet Take  by mouth every four (4) hours as needed for Pain.     ??? naproxen sodium (ALEVE) 220 mg Cap Take  by mouth.         No Known Allergies  Past Medical History:   Diagnosis Date   ??? 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 In [place]. Cath:   LAD obstruction   ; tx: 2 stents. Also has obstruction  in  2nd marginal of the LCX  Proximal aortic root dilatation.  Several days after cath: Echo: EF 49 %. [name]     ??? Allergic rhinitis    ??? Bicuspid aortic valve 08/01/2013   ??? Bronchitis 12/30/2010   ??? CAD (coronary artery disease)    ??? Depression 12/30/2010   ??? Eczema 12/30/2010   ??? Hypercholesterolemia 06/24/2011   ??? Pneumonia     several episodes in childhood.      Past Surgical History:   Procedure Laterality Date    ??? HX COLONOSCOPY      age 3 . results negative . f/u 10 years    ??? HX HEART CATHETERIZATION      also separately, Bicuspid Aortic Valve repair. Thoracic Aortic Aneursym repair 2015 [facility]    ??? HX ORTHOPAEDIC  11/2015    Left Knee arthroscopic Meniscectomy    ??? HX OTHER SURGICAL      removal of cysts   ??? HX UROLOGICAL      vasectomy; vasectomy reversal .     Family History   Problem Relation Age of Onset   ??? Heart Disease Father    ??? Asthma Mother    ??? Asthma Brother      Social History     Tobacco Use   ??? Smoking status: Never Smoker   ??? Smokeless tobacco: Current User   Substance Use Topics   ??? Alcohol use: Yes     Alcohol/week: 2.0 standard drinks     Types: 2  Glasses of wine per week     Comment: social         ROS: as per HPI otherwise NEG    Objective:  Visit Vitals  BP 104/82   Temp (!) 101.5 ??F (38.6 ??C)   Ht 5' 9.5" (1.765 m)   Wt 216 lb (98 kg)   BMI 31.44 kg/m??       In NAD. Alert, appears ill  Head/Neck -- Supple, no LAD, TM intact, Throat no erythema or exudates, mild maxillary sinus tenderness  Heart -- RRR.  Lungs -- CTA.    Extremities -- No edema          Recent Results (from the past 24 hour(s))   AMB POC INFLUENZA A  AND B REAL-TIME RT-PCR    Collection Time: 05/26/18  3:52 PM   Result Value Ref Range    VALID INTERNAL CONTROL POC Yes     Influenza A Ag POC Positive Negative Pos/Neg    Influenza B Ag POC Negative Negative Pos/Neg   AMB POC COMPLETE CBC,AUTOMATED ENTER    Collection Time: 05/26/18  3:52 PM   Result Value Ref Range    WBC (POC) 6.7 4.5 - 10.5 K/uL    LYMPHOCYTES (POC) 22.1 20.5 - 51.1 %    MONOCYTES (POC) 1.8 1.7 - 9.3 %    GRANULOCYTES (POC) 76.1 (A) 42.2 - 75.2 %    ABS. LYMPHS (POC) 1.5 1.2 - 3.4 K/uL    ABS. MONOS (POC) 0.1 0.1 - 0.6 10^3/ul    ABS. GRANS (POC) 5.1 1.4 - 6.5 10^3/ul    RBC (POC) 6.24 (A) 4.00 - 6.00 M/uL    HGB (POC) 18.7 (A) 11 - 18 g/dL    HCT (POC) 03.5 46.5 - 60.0 %    MCV (POC) 91.1 80.0 - 99.9 fL    MCH (POC) 29.9 27.0 - 31.0 pg    MCHC (POC)  32.8 (A) 33.0 - 37.0 g/dL    RDW (POC) 68.1 27.5 - 13.7 %    PLATELET (POC) 152 150 - 450 K/uL    MPV (POC) 7.9 7.8 - 11 fL       Assessment/Plan:    ICD-10-CM ICD-9-CM    1. Influenza A J10.1 487.1 AMB POC INFLUENZA A  AND B REAL-TIME RT-PCR      AMB POC COMPLETE CBC,AUTOMATED ENTER      oseltamivir (TAMIFLU) 75 mg capsule   2. Cough R05 786.2 AMB POC INFLUENZA A  AND B REAL-TIME RT-PCR      AMB POC COMPLETE CBC,AUTOMATED ENTER       Positive Flu test rev'd  Tamiflu as dir  OTC analgesics   Call if sx worsens/persists        Author:  Andi Layfield A Peja Allender-Edwards, PA 05/26/2018 3:17 PM

## 2018-06-01 MED ORDER — METHYLPREDNISOLONE 4 MG TABS IN A DOSE PACK
4 mg | ORAL | 0 refills | Status: DC
Start: 2018-06-01 — End: 2020-02-15

## 2018-06-01 NOTE — Progress Notes (Signed)
Pc. C/o wheezing. Denies fever or nasal d/c. ERX Medrol pack . F/u prn

## 2018-06-01 NOTE — Progress Notes (Signed)
Pc. C/o wheezing. Denies fever or nasal d/c. ERX Medrol pack . F/u prn

## 2018-07-05 MED ORDER — BUSPIRONE 5 MG TAB
5 mg | ORAL_TABLET | Freq: Every day | ORAL | 0 refills | Status: DC | PRN
Start: 2018-07-05 — End: 2018-09-21

## 2018-07-05 NOTE — Progress Notes (Signed)
Called pt and renewed Buspar. He reports generally doing well. F/u prn

## 2018-07-05 NOTE — Progress Notes (Signed)
Called pt and renewed Buspar. He reports generally doing well. F/u prn

## 2018-09-21 NOTE — Progress Notes (Signed)
Communicated with pt. Buspar renewed. F/u prn

## 2018-09-21 NOTE — Progress Notes (Signed)
Communicated with pt. Buspar renewed. F/u prn

## 2018-09-22 MED ORDER — BUSPIRONE 5 MG TAB
5 mg | ORAL_TABLET | Freq: Every day | ORAL | 0 refills | Status: DC | PRN
Start: 2018-09-22 — End: 2019-10-10

## 2019-01-05 NOTE — Telephone Encounter (Signed)
Refill     Buspar 5 mg tab 1 tab daily as needed for anxiety     Pt ph# [phone]    [facility] ph# [phone]

## 2019-02-16 ENCOUNTER — Ambulatory Visit: Attending: Internal Medicine | Primary: Internal Medicine

## 2019-02-16 DIAGNOSIS — Z0000 Encounter for general adult medical examination without abnormal findings: Secondary | ICD-10-CM

## 2019-02-16 NOTE — Progress Notes (Signed)
No show

## 2019-02-21 MED ORDER — EZETIMIBE 10 MG TAB
10 mg | ORAL_TABLET | ORAL | 1 refills | Status: DC
Start: 2019-02-21 — End: 2019-09-01

## 2019-09-01 MED ORDER — EZETIMIBE 10 MG TAB
10 mg | ORAL_TABLET | ORAL | 1 refills | Status: DC
Start: 2019-09-01 — End: 2020-01-12

## 2019-10-10 MED ORDER — BUSPIRONE 5 MG TAB
5 mg | ORAL_TABLET | ORAL | 0 refills | Status: DC
Start: 2019-10-10 — End: 2019-12-05

## 2019-10-12 ENCOUNTER — Encounter: Attending: Internal Medicine | Primary: Internal Medicine

## 2019-11-28 ENCOUNTER — Ambulatory Visit: Payer: PRIVATE HEALTH INSURANCE | Primary: Internal Medicine

## 2019-11-28 ENCOUNTER — Inpatient Hospital Stay: Admit: 2019-11-28 | Payer: PRIVATE HEALTH INSURANCE | Attending: Orthopaedic Surgery | Primary: Internal Medicine

## 2019-11-28 ENCOUNTER — Encounter

## 2019-11-28 DIAGNOSIS — M75121 Complete rotator cuff tear or rupture of right shoulder, not specified as traumatic: Secondary | ICD-10-CM

## 2019-12-01 ENCOUNTER — Inpatient Hospital Stay: Payer: PRIVATE HEALTH INSURANCE | Attending: Orthopaedic Surgery | Primary: Internal Medicine

## 2019-12-05 MED ORDER — BUSPIRONE 5 MG TAB
5 mg | ORAL_TABLET | ORAL | 0 refills | Status: DC
Start: 2019-12-05 — End: 2020-03-21

## 2019-12-05 NOTE — Telephone Encounter (Signed)
New prescription for his     Buspar 5 mg tab     [facility] ph# [phone]    Pt ph# [phone]

## 2019-12-05 NOTE — Telephone Encounter (Signed)
OK # 30 one pill daily as needed. No refills. He needs physical appointment soon

## 2020-01-12 MED ORDER — EZETIMIBE 10 MG TAB
10 mg | ORAL_TABLET | ORAL | 0 refills | Status: DC
Start: 2020-01-12 — End: 2020-02-15

## 2020-02-15 ENCOUNTER — Ambulatory Visit: Attending: Internal Medicine | Primary: Internal Medicine

## 2020-02-15 ENCOUNTER — Ambulatory Visit: Admit: 2020-02-15 | Attending: Internal Medicine | Primary: Internal Medicine

## 2020-02-15 DIAGNOSIS — Z0000 Encounter for general adult medical examination without abnormal findings: Secondary | ICD-10-CM

## 2020-02-15 MED ORDER — EZETIMIBE 10 MG TAB
10 mg | ORAL_TABLET | Freq: Every day | ORAL | 3 refills | Status: DC
Start: 2020-02-15 — End: 2021-02-22

## 2020-02-15 MED ORDER — EZETIMIBE 10 MG TAB
10 mg | ORAL_TABLET | ORAL | 0 refills | Status: DC
Start: 2020-02-15 — End: 2020-02-15

## 2020-02-15 NOTE — Progress Notes (Signed)
Jason Shah is a 54 y.o. Jason Shah/m  CC: Routine Physical     HPI     Routine Physical     Past Medical History:   Diagnosis Date   ??? 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 In [place]. Cath:   LAD obstruction   ; tx: 2 stents. Also has obstruction  in  2nd marginal of the LCX  Proximal aortic root dilatation.  Several days after cath: Echo: EF 49 %. Jason Shah     ??? Allergic rhinitis    ??? Bicuspid aortic valve 08/01/2013   ??? Bronchitis 12/30/2010   ??? CAD (coronary artery disease)    ??? Depression 12/30/2010   ??? Eczema 12/30/2010   ??? Hypercholesterolemia 06/24/2011   ??? Pneumonia     several episodes in childhood.      Past Surgical History:   Procedure Laterality Date   ??? HX COLONOSCOPY      age 78 . results negative . f/u 10 years    ??? HX HEART CATHETERIZATION      also separately, Bicuspid Aortic Valve repair. Thoracic Aortic Aneursym repair 2015 [facility]    ??? HX ORTHOPAEDIC  11/2015    Left Knee arthroscopic Meniscectomy    ??? HX ORTHOPAEDIC  02/2019    Left knee unicompartmental replacectomy Dr. Evalyn Casco   ??? HX ORTHOPAEDIC  07/2019    rightknee arthroscopic Meniscectomy    ??? HX ORTHOPAEDIC  1994    rt shoulder bankhardt sgy    ??? HX ORTHOPAEDIC  11/2019    rt rtc repair . rt long head bicep sgy repair    ??? HX OTHER SURGICAL      removal of cysts   ??? HX UROLOGICAL      vasectomy; vasectomy reversal .     Current Outpatient Medications   Medication Sig Dispense Refill   ??? meloxicam (MOBIC) 15 mg tablet Take 15 mg by mouth daily as needed for Pain.     ??? ezetimibe (ZETIA) 10 mg tablet Take 1 Tablet by mouth daily. 90 Tablet 3   ??? busPIRone (BUSPAR) 5 mg tablet TAKE 1 TABLET BY MOUTH ONCE DAILY AS NEEDED FOR ANXIETY 90 Tablet 0   ??? albuterol (PROVENTIL HFA, VENTOLIN HFA, PROAIR HFA) 90 mcg/actuation inhaler Take 2 Puffs by inhalation every four (4) hours as needed for Wheezing. 1 Inhaler 11   ??? atorvastatin (LIPITOR) 80 mg tablet Take 1 Tab by mouth daily. 30 Tab 11   ??? Cetirizine (ZYRTEC) 10 mg cap Take  by mouth. Daily      ??? albuterol (PROAIR HFA) 90 mcg/actuation inhaler Take 2 Puffs by inhalation every four (4) hours as needed for Wheezing.     ??? aspirin 81 mg chewable tablet Take 162 mg by mouth daily.     ??? acetaminophen (TYLENOL) 325 mg tablet Take  by mouth every four (4) hours as needed for Pain.     ??? naproxen sodium (ALEVE) 220 mg Cap Take  by mouth.         Allergies   Allergen Reactions   ??? Crestor [Rosuvastatin] Myalgia     Family History   Problem Relation Age of Onset   ??? Heart Disease Father    ??? Asthma Mother    ??? Asthma Brother      Social History     Tobacco Use   Smoking Status Never Smoker   Smokeless Tobacco Current User       Review  of Systems   Denies dyspnea.; OSAS; no CPAP. Snores. Reading glasses   Cardiac:  Denies chest pain; infrequent angina. occasional palpitations responding to cough ; denies dizziness, syncope ; 8 m ago neg Nuclear Stress Test Dr. Carren Rang.    GI: Bowels move regularly.. few weeks ago little brbpr   Urination without difficulty; more frequent , less volume vs yesteryear   Knees intermittent pain. Rt shoulder improving rom  Passes Depression screen  In cold weather  Colder right #2 finger distally       Physical Examination:  Visit Vitals  BP 134/88   Temp 97 ??F (36.1 ??C)   Ht 5' 9.5" (1.765 m)   Wt 230 lb (104.3 kg)   BMI 33.48 kg/m??     Physical Exam  General:   Appears in no acute distress.   HEENT:   Negative                   Lungs:   Clear        Heart:  Regular without murmur, gallop or rub       Abdomen:   Benign exam without organomegaly or mass palpable                Rectal:   Small external hemorrhoid soft , nt ; DRE : smooth prostate. No rectal mass felt    Extremities: No edema; r 2nd finger neurovascular intact ; rt shoulder good lateral rom   Rt knee stable. Left knee good f/e s/p sgy    Neurologic: Grossly nonfocal      No results found for this or any previous visit (from the past 8 hour(s)).              Results for orders placed or performed in visit on  05/26/18   AMB POC INFLUENZA A  AND B REAL-TIME RT-PCR   Result Value Ref Range    VALID INTERNAL CONTROL POC Yes     Influenza A Ag POC Positive Negative Pos/Neg    Influenza B Ag POC Negative Negative Pos/Neg   AMB POC COMPLETE CBC,AUTOMATED ENTER   Result Value Ref Range    WBC (POC) 6.7 4.5 - 10.5 K/uL    LYMPHOCYTES (POC) 22.1 20.5 - 51.1 %    MONOCYTES (POC) 1.8 1.7 - 9.3 %    GRANULOCYTES (POC) 76.1 (A) 42.2 - 75.2 %    ABS. LYMPHS (POC) 1.5 1.2 - 3.4 K/uL    ABS. MONOS (POC) 0.1 0.1 - 0.6 10^3/ul    ABS. GRANS (POC) 5.1 1.4 - 6.5 10^3/ul    RBC (POC) 6.24 (A) 4.00 - 6.00 M/uL    HGB (POC) 18.7 (A) 11 - 18 g/dL    HCT (POC) 02.5 42.7 - 60.0 %    MCV (POC) 91.1 80.0 - 99.9 fL    MCH (POC) 29.9 27.0 - 31.0 pg    MCHC (POC) 32.8 (A) 33.0 - 37.0 g/dL    RDW (POC) 06.2 37.6 - 13.7 %    PLATELET (POC) 152 150 - 450 K/uL    MPV (POC) 7.9 7.8 - 11 fL     Assessment/Plan    ICD-10-CM ICD-9-CM    1. Routine general medical examination at a health care facility  Z00.00 V70.0 LIPID PANEL      CBC WITH AUTOMATED DIFF      URINALYSIS Tilden Broz/ RFLX MICROSCOPIC      METABOLIC PANEL, COMPREHENSIVE      PSA, DIAGNOSTIC (PROSTATE  SPECIFIC AG)      COLLECTION VENOUS BLOOD,VENIPUNCTURE     CAD appears clinically stable. Same rx   Suspect hemorrhoidal bleeding . Follow   Suspect rt index finger vasospasm. Use warm gloves   Lipid d/o await studies   Suspect BPH. Check PSA  Recovering from Ortho sgy's. Continue rehab   OSAS. With snoring. Referred for sleep study   RV 1 y or prn sooner     Author:  Ysidro Evert, MD 1:56 PM10/27/2021

## 2020-02-15 NOTE — Progress Notes (Signed)
Pc- pt had missed some Lipitor before his appt but reports being back on it . Discussed meds and labs. Rpt lipids, AST in 6 weeks. Follow up sooner prn

## 2020-02-16 LAB — COMPREHENSIVE METABOLIC PANEL
ALT: 48 IU/L — ABNORMAL HIGH (ref 0–44)
AST: 29 IU/L (ref 0–40)
Albumin/Globulin Ratio: 2.5 NA — ABNORMAL HIGH (ref 1.2–2.2)
Albumin: 4.9 g/dL (ref 3.8–4.9)
Alkaline Phosphatase: 68 IU/L (ref 44–121)
BUN: 19 mg/dL (ref 6–24)
Bun/Cre Ratio: 17 NA (ref 9–20)
CO2: 21 mmol/L (ref 20–29)
Calcium: 9.4 mg/dL (ref 8.7–10.2)
Chloride: 103 mmol/L (ref 96–106)
Creatinine: 1.1 mg/dL (ref 0.76–1.27)
EGFR IF NonAfrican American: 75 mL/min/{1.73_m2} (ref >59)
GFR African American: 87 mL/min/{1.73_m2} (ref >59)
Globulin, Total: 2 g/dL (ref 1.5–4.5)
Glucose: 94 mg/dL (ref 65–99)
Potassium: 4.4 mmol/L (ref 3.5–5.2)
Sodium: 142 mmol/L (ref 134–144)
Total Bilirubin: 0.5 mg/dL (ref 0.0–1.2)
Total Protein: 6.9 g/dL (ref 6.0–8.5)

## 2020-02-16 LAB — URINALYSIS W/ RFLX MICROSCOPIC
Bilirubin, Urine: NEGATIVE
Bilirubin: NEGATIVE
Blood, Urine: NEGATIVE
Blood: NEGATIVE
Glucose, UA: NEGATIVE
Glucose: NEGATIVE
Ketone: NEGATIVE
Ketones, Urine: NEGATIVE
Leukocyte Esterase, Urine: NEGATIVE
Leukocyte Esterase: NEGATIVE
Nitrite, Urine: NEGATIVE
Nitrites: NEGATIVE
Protein, UA: NEGATIVE
Protein: NEGATIVE
Specific Gravity, UA: 1.021 NA (ref 1.005–1.030)
Specific Gravity: 1.021 (ref 1.005–1.030)
Urobilinogen, Urine: 0.2 mg/dL (ref 0.2–1.0)
Urobilinogen: 0.2 mg/dL (ref 0.2–1.0)
pH (UA): 5.5 (ref 5.0–7.5)
pH, UA: 5.5 NA (ref 5.0–7.5)

## 2020-02-16 LAB — CBC WITH AUTO DIFFERENTIAL
Basophils %: 1 %
Basophils Absolute: 0.1 10*3/uL (ref 0.0–0.2)
Eosinophils %: 3 %
Eosinophils Absolute: 0.2 10*3/uL (ref 0.0–0.4)
Granulocyte Absolute Count: 0 10*3/uL (ref 0.0–0.1)
Hematocrit: 50.3 % (ref 37.5–51.0)
Hemoglobin: 17.4 g/dL (ref 13.0–17.7)
Immature Granulocytes: 0 %
Lymphocytes %: 34 %
Lymphocytes Absolute: 2.4 10*3/uL (ref 0.7–3.1)
MCH: 31 pg (ref 26.6–33.0)
MCHC: 34.6 g/dL (ref 31.5–35.7)
MCV: 90 fL (ref 79–97)
Monocytes %: 9 %
Monocytes Absolute: 0.6 10*3/uL (ref 0.1–0.9)
Neutrophils %: 53 %
Neutrophils Absolute: 3.9 10*3/uL (ref 1.4–7.0)
Platelets: 215 10*3/uL (ref 150–450)
RBC: 5.62 x10E6/uL (ref 4.14–5.80)
RDW: 12.4 % (ref 11.6–15.4)
WBC: 7.2 10*3/uL (ref 3.4–10.8)

## 2020-02-16 LAB — LIPID PANEL
Cholesterol, Total: 293 mg/dL — ABNORMAL HIGH (ref 100–199)
Cholesterol, total: 293 mg/dL — ABNORMAL HIGH (ref 100–199)
HDL Cholesterol: 62 mg/dL (ref >39)
HDL: 62 mg/dL (ref >39)
LDL Calculated: 203 mg/dL — ABNORMAL HIGH (ref 0–99)
LDL, calculated: 203 mg/dL — ABNORMAL HIGH (ref 0–99)
Triglyceride: 151 mg/dL — ABNORMAL HIGH (ref 0–149)
Triglycerides: 151 mg/dL — ABNORMAL HIGH (ref 0–149)
VLDL, calculated: 28 mg/dL (ref 5–40)
VLDL: 28 mg/dL (ref 5–40)

## 2020-02-16 LAB — PSA PROSTATIC SPECIFIC ANTIGEN: PSA: 0.4 ng/mL (ref 0.0–4.0)

## 2020-02-16 LAB — METABOLIC PANEL, COMPREHENSIVE
A-G Ratio: 2.5 — ABNORMAL HIGH (ref 1.2–2.2)
ALT (SGPT): 48 IU/L — ABNORMAL HIGH (ref 0–44)
AST (SGOT): 29 IU/L (ref 0–40)
Albumin: 4.9 g/dL (ref 3.8–4.9)
Alk. phosphatase: 68 IU/L (ref 44–121)
BUN/Creatinine ratio: 17 (ref 9–20)
BUN: 19 mg/dL (ref 6–24)
Bilirubin, total: 0.5 mg/dL (ref 0.0–1.2)
CO2: 21 mmol/L (ref 20–29)
Calcium: 9.4 mg/dL (ref 8.7–10.2)
Chloride: 103 mmol/L (ref 96–106)
Creatinine: 1.1 mg/dL (ref 0.76–1.27)
GFR est AA: 87 mL/min/{1.73_m2} (ref >59)
GFR est non-AA: 75 mL/min/{1.73_m2} (ref >59)
GLOBULIN, TOTAL: 2 g/dL (ref 1.5–4.5)
Glucose: 94 mg/dL (ref 65–99)
Potassium: 4.4 mmol/L (ref 3.5–5.2)
Protein, total: 6.9 g/dL (ref 6.0–8.5)
Sodium: 142 mmol/L (ref 134–144)

## 2020-02-16 LAB — CBC WITH AUTOMATED DIFF
ABS. BASOPHILS: 0.1 10*3/uL (ref 0.0–0.2)
ABS. EOSINOPHILS: 0.2 10*3/uL (ref 0.0–0.4)
ABS. IMM. GRANS.: 0 10*3/uL (ref 0.0–0.1)
ABS. MONOCYTES: 0.6 10*3/uL (ref 0.1–0.9)
ABS. NEUTROPHILS: 3.9 10*3/uL (ref 1.4–7.0)
Abs Lymphocytes: 2.4 10*3/uL (ref 0.7–3.1)
BASOPHILS: 1 %
EOSINOPHILS: 3 %
HCT: 50.3 % (ref 37.5–51.0)
HGB: 17.4 g/dL (ref 13.0–17.7)
IMMATURE GRANULOCYTES: 0 %
Lymphocytes: 34 %
MCH: 31 pg (ref 26.6–33.0)
MCHC: 34.6 g/dL (ref 31.5–35.7)
MCV: 90 fL (ref 79–97)
MONOCYTES: 9 %
NEUTROPHILS: 53 %
PLATELET: 215 10*3/uL (ref 150–450)
RBC: 5.62 x10E6/uL (ref 4.14–5.80)
RDW: 12.4 % (ref 11.6–15.4)
WBC: 7.2 10*3/uL (ref 3.4–10.8)

## 2020-02-16 LAB — PSA, DIAGNOSTIC (PROSTATE SPECIFIC AG): Prostate Specific Ag: 0.4 ng/mL (ref 0.0–4.0)

## 2020-03-21 MED ORDER — BUSPIRONE 5 MG TAB
5 mg | ORAL_TABLET | ORAL | 0 refills | Status: AC
Start: 2020-03-21 — End: ?

## 2020-03-21 MED ORDER — PROAIR HFA 90 MCG/ACTUATION AEROSOL INHALER
90 mcg/actuation | RESPIRATORY_TRACT | 0 refills | Status: AC
Start: 2020-03-21 — End: ?

## 2021-02-18 ENCOUNTER — Ambulatory Visit: Attending: Internal Medicine | Primary: Internal Medicine

## 2021-02-18 ENCOUNTER — Ambulatory Visit: Admit: 2021-02-18 | Discharge: 2021-02-19 | Attending: Internal Medicine | Primary: Internal Medicine

## 2021-02-18 DIAGNOSIS — Z0000 Encounter for general adult medical examination without abnormal findings: Secondary | ICD-10-CM

## 2021-02-18 NOTE — Progress Notes (Signed)
Jason Shah is a 55 y.o. wm CC: Routine Physical     HPI     Routine Physical     Past Medical History:   Diagnosis Date    Acute anterior wall MI ([place]) 06/24/2011    06/21/11. In [place]. Cath:   LAD obstruction   ; tx: 2 stents. Also has obstruction  in  2nd marginal of the LCX  Proximal aortic root dilatation.  Several days after cath: Echo: EF 49 %. .      Allergic rhinitis     Arthritis     Cervical spondylosis    Bicuspid aortic valve 08/01/2013    Bronchitis 12/30/2010    CAD (coronary artery disease)     Cervical disc disease     C5-C6 disc disease.    Depression 12/30/2010    Eczema 12/30/2010    Hypercholesterolemia 06/24/2011    Pneumonia     several episodes in childhood.      Past Surgical History:   Procedure Laterality Date    HX COLONOSCOPY      age 17 . results negative . f/u 10 years     HX HEART CATHETERIZATION      also separately, Bicuspid Aortic Valve repair. Thoracic Aortic Aneursym repair 2015 [facility]     HX ORTHOPAEDIC  11/2015    Left Knee arthroscopic Meniscectomy     HX ORTHOPAEDIC  02/2019    Left knee unicompartmental replacectomy Dr. Noland Fordyce ORTHOPAEDIC  07/2019    rightknee arthroscopic Meniscectomy     HX ORTHOPAEDIC  1994    rt shoulder bankhardt sgy     HX ORTHOPAEDIC  11/2019    rt rtc repair . rt long head bicep sgy repair     HX OTHER SURGICAL      removal of cysts    HX UROLOGICAL      vasectomy; vasectomy reversal .     Current Outpatient Medications   Medication Sig Dispense Refill    gabapentin (NEURONTIN) 300 mg capsule Take 300 mg by mouth two (2) times a day.      tiZANidine (ZANAFLEX) 2 mg capsule Take 2 mg by mouth three (3) times daily as needed.      Famotidine-Ca Carb-Mag Hydrox (Pepcid Complete) 10-800-165 mg chew Take  by mouth daily as needed.      esomeprazole (NexIUM) 40 mg capsule Take  by mouth daily as needed for Gastroesophageal Reflux Disease (GERD).      Cetirizine (ZyrTEC) 10 mg cap Take  by mouth.      busPIRone (BUSPAR) 5 mg tablet TAKE  1 TABLET BY MOUTH ONCE DAILY AS NEEDED FOR ANXIETY 90 Tablet 0    ProAir HFA 90 mcg/actuation inhaler INHALE 2 PUFFS BY MOUTH EVERY 4 HOURS AS NEEDED FOR WHEEZING 9 g 0    meloxicam (MOBIC) 15 mg tablet Take 15 mg by mouth daily as needed for Pain.      ezetimibe (ZETIA) 10 mg tablet Take 1 Tablet by mouth daily. 90 Tablet 3    atorvastatin (LIPITOR) 80 mg tablet Take 1 Tab by mouth daily. 30 Tab 11    Cetirizine (ZYRTEC) 10 mg cap Take  by mouth. Daily      albuterol (PROAIR HFA) 90 mcg/actuation inhaler Take 2 Puffs by inhalation every four (4) hours as needed for Wheezing.      aspirin 81 mg chewable tablet Take 162 mg by mouth daily.      acetaminophen (  TYLENOL) 325 mg tablet Take  by mouth every four (4) hours as needed for Pain.      naproxen sodium (ALEVE) 220 mg Cap Take  by mouth.         Allergies   Allergen Reactions    Crestor [Rosuvastatin] Myalgia     Family History   Problem Relation Age of Onset    Heart Disease Father     Asthma Mother     Asthma Brother      Social History     Tobacco Use   Smoking Status Never   Smokeless Tobacco Current       Review of Systems      UE cramp, r UE weakness. Cervical disc herniation  .   Tolearrting CPAP Ok;  sleep on back Denies dyspnea.[name] exercise 2    Cardiac:   one recent one episode  palpitiations  x 10 minute, occurred  after exercise responded to water.  Denies chest pain    GI: Bowels move regularly without hematochezia.[name]  Urination without difficulty  R lower back pian resopnds change position   Left hip oa pain. At times r knee pain and swelling r knee and Right ankle .      Passes Depression screen     Physical Examination:  Visit Vitals  BP 122/82   Temp 97.5 ??F (36.4 ??C)   Ht '5\' 11"'  (1.803 m)   Wt 233 lb (105.7 kg)   BMI 32.50 kg/m??     Physical Exam  General:   Appears in no acute distress.   HEENT:   Negative                   Lungs:   Clear        Heart:  Regular without murmur, gallop or rub       Abdomen:   Benign exam without organomegaly or mass  palpable                Rectal:  Smooth prostate  no rectal mass felt   Extremities: No edema   Neurologic: Grossly nonfocal    EKG:  Sinus Bradycardia  60 ; old anteroseptal MI. Non specific T wave abnormalities. .    No results found for this or any previous visit (from the past 8 hour(s)).              Results for orders placed or performed in visit on 02/15/20   LIPID PANEL   Result Value Ref Range    Cholesterol, total 293 (H) 100 - 199 mg/dL    Triglyceride 151 (H) 0 - 149 mg/dL    HDL Cholesterol 62 >39 mg/dL    VLDL, calculated 28 5 - 40 mg/dL    LDL, calculated 203 (H) 0 - 99 mg/dL   CBC WITH AUTOMATED DIFF   Result Value Ref Range    WBC 7.2 3.4 - 10.8 x10E3/uL    RBC 5.62 4.14 - 5.80 x10E6/uL    HGB 17.4 13.0 - 17.7 g/dL    HCT 50.3 37.5 - 51.0 %    MCV 90 79 - 97 fL    MCH 31.0 26.6 - 33.0 pg    MCHC 34.6 31.5 - 35.7 g/dL    RDW 12.4 11.6 - 15.4 %    PLATELET 215 150 - 450 x10E3/uL    NEUTROPHILS 53 Not Estab. %    Lymphocytes 34 Not Estab. %    MONOCYTES 9 Not Estab. %  EOSINOPHILS 3 Not Estab. %    BASOPHILS 1 Not Estab. %    ABS. NEUTROPHILS 3.9 1.4 - 7.0 x10E3/uL    Abs Lymphocytes 2.4 0.7 - 3.1 x10E3/uL    ABS. MONOCYTES 0.6 0.1 - 0.9 x10E3/uL    ABS. EOSINOPHILS 0.2 0.0 - 0.4 x10E3/uL    ABS. BASOPHILS 0.1 0.0 - 0.2 x10E3/uL    IMMATURE GRANULOCYTES 0 Not Estab. %    ABS. IMM. GRANS. 0.0 0.0 - 0.1 x10E3/uL   URINALYSIS Nazanin Kinner/ RFLX MICROSCOPIC   Result Value Ref Range    Specific Gravity 1.021 1.005 - 1.030    pH (UA) 5.5 5.0 - 7.5    Color Yellow Yellow    Appearance Clear Clear    Leukocyte Esterase Negative Negative    Protein Negative Negative/Trace    Glucose Negative Negative    Ketone Negative Negative    Blood Negative Negative    Bilirubin Negative Negative    Urobilinogen 0.2 0.2 - 1.0 mg/dL    Nitrites Negative Negative    Microscopic Examination Comment    METABOLIC PANEL, COMPREHENSIVE   Result Value Ref Range    Glucose 94 65 - 99 mg/dL    BUN 19 6 - 24 mg/dL    Creatinine 1.10 0.76 -  1.27 mg/dL    GFR est non-AA 75 >59 mL/min/1.73    GFR est AA 87 >59 mL/min/1.73    BUN/Creatinine ratio 17 9 - 20    Sodium 142 134 - 144 mmol/L    Potassium 4.4 3.5 - 5.2 mmol/L    Chloride 103 96 - 106 mmol/L    CO2 21 20 - 29 mmol/L    Calcium 9.4 8.7 - 10.2 mg/dL    Protein, total 6.9 6.0 - 8.5 g/dL    Albumin 4.9 3.8 - 4.9 g/dL    GLOBULIN, TOTAL 2.0 1.5 - 4.5 g/dL    A-G Ratio 2.5 (H) 1.2 - 2.2    Bilirubin, total 0.5 0.0 - 1.2 mg/dL    Alk. phosphatase 68 44 - 121 IU/L    AST (SGOT) 29 0 - 40 IU/L    ALT (SGPT) 48 (H) 0 - 44 IU/L   PSA, DIAGNOSTIC (PROSTATE SPECIFIC AG)   Result Value Ref Range    Prostate Specific Ag 0.4 0.0 - 4.0 ng/mL     Assessment/Plan    ICD-10-CM ICD-9-CM    1. Routine general medical examination at a health care facility  Z00.00 V70.0 LIPID PANEL      CBC WITH AUTOMATED DIFF      AMB POC EKG ROUTINE Renelle Stegenga/ 12 LEADS, INTER & REP      METABOLIC PANEL, COMPREHENSIVE      URINALYSIS Naleigha Raimondi/ RFLX MICROSCOPIC      PSA, DIAGNOSTIC (PROSTATE SPECIFIC AG)      COLLECTION VENOUS BLOOD,VENIPUNCTURE        Palpitations. Check labs.   CAD appears clinically stable. Same rx   Await lipids   Follow up Cardiology   Cervical radiculpathy. Follow up for Dr. Janice Coffin , Nsgy   AVS reviewed Shray Hunley/ pt  RV 1 y here or prn sooner     Author:  Darlina Rumpf, MD 10:07 AM10/31/2022

## 2021-02-19 LAB — LIPID PANEL
Cholesterol, Total: 222 mg/dL — ABNORMAL HIGH (ref 100–199)
Cholesterol, total: 222 mg/dL — ABNORMAL HIGH (ref 100–199)
HDL Cholesterol: 64 mg/dL (ref >39)
HDL: 64 mg/dL (ref >39)
LDL Calculated: 131 mg/dL — ABNORMAL HIGH (ref 0–99)
LDL, calculated: 131 mg/dL — ABNORMAL HIGH (ref 0–99)
Triglyceride: 154 mg/dL — ABNORMAL HIGH (ref 0–149)
Triglycerides: 154 mg/dL — ABNORMAL HIGH (ref 0–149)
VLDL, calculated: 27 mg/dL (ref 5–40)
VLDL: 27 mg/dL (ref 5–40)

## 2021-02-19 LAB — URINALYSIS W/ RFLX MICROSCOPIC
Bilirubin, Urine: NEGATIVE
Bilirubin: NEGATIVE
Blood, Urine: NEGATIVE
Blood: NEGATIVE
Glucose, UA: NEGATIVE
Glucose: NEGATIVE
Ketone: NEGATIVE
Ketones, Urine: NEGATIVE
Leukocyte Esterase, Urine: NEGATIVE
Leukocyte Esterase: NEGATIVE
Nitrite, Urine: NEGATIVE
Nitrites: NEGATIVE
Protein, UA: NEGATIVE
Protein: NEGATIVE
Specific Gravity, UA: 1.022 NA (ref 1.005–1.030)
Specific Gravity: 1.022 (ref 1.005–1.030)
Urobilinogen, Urine: 0.2 mg/dL (ref 0.2–1.0)
Urobilinogen: 0.2 mg/dL (ref 0.2–1.0)
pH (UA): 5.5 (ref 5.0–7.5)
pH, UA: 5.5 NA (ref 5.0–7.5)

## 2021-02-19 LAB — CBC WITH AUTO DIFFERENTIAL
Basophils %: 1 %
Basophils Absolute: 0.1 10*3/uL (ref 0.0–0.2)
Eosinophils %: 5 %
Eosinophils Absolute: 0.3 10*3/uL (ref 0.0–0.4)
Granulocyte Absolute Count: 0 10*3/uL (ref 0.0–0.1)
Hematocrit: 48.4 % (ref 37.5–51.0)
Hemoglobin: 16.7 g/dL (ref 13.0–17.7)
Immature Granulocytes: 1 %
Lymphocytes %: 31 %
Lymphocytes Absolute: 1.9 10*3/uL (ref 0.7–3.1)
MCH: 31 pg (ref 26.6–33.0)
MCHC: 34.5 g/dL (ref 31.5–35.7)
MCV: 90 fL (ref 79–97)
Monocytes %: 8 %
Monocytes Absolute: 0.5 10*3/uL (ref 0.1–0.9)
Neutrophils %: 54 %
Neutrophils Absolute: 3.3 10*3/uL (ref 1.4–7.0)
Platelets: 183 10*3/uL (ref 150–450)
RBC: 5.38 x10E6/uL (ref 4.14–5.80)
RDW: 12.2 % (ref 11.6–15.4)
WBC: 6 10*3/uL (ref 3.4–10.8)

## 2021-02-19 LAB — COMPREHENSIVE METABOLIC PANEL
ALT: 35 IU/L (ref 0–44)
AST: 28 IU/L (ref 0–40)
Albumin/Globulin Ratio: 1.8 NA (ref 1.2–2.2)
Albumin: 4.5 g/dL (ref 3.8–4.9)
Alkaline Phosphatase: 71 IU/L (ref 44–121)
BUN: 18 mg/dL (ref 6–24)
Bun/Cre Ratio: 13 NA (ref 9–20)
CO2: 23 mmol/L (ref 20–29)
Calcium: 9.2 mg/dL (ref 8.7–10.2)
Chloride: 103 mmol/L (ref 96–106)
Creatinine: 1.34 mg/dL — ABNORMAL HIGH (ref 0.76–1.27)
Est, Glomerular Filtration Rate: 62 mL/min/{1.73_m2} (ref >59)
Globulin, Total: 2.5 g/dL (ref 1.5–4.5)
Glucose: 118 mg/dL — ABNORMAL HIGH (ref 70–99)
Potassium: 4.6 mmol/L (ref 3.5–5.2)
Sodium: 139 mmol/L (ref 134–144)
Total Bilirubin: 0.5 mg/dL (ref 0.0–1.2)
Total Protein: 7 g/dL (ref 6.0–8.5)

## 2021-02-19 LAB — PSA PROSTATIC SPECIFIC ANTIGEN: PSA: 0.4 ng/mL (ref 0.0–4.0)

## 2021-02-19 LAB — CBC WITH AUTOMATED DIFF
ABS. BASOPHILS: 0.1 10*3/uL (ref 0.0–0.2)
ABS. EOSINOPHILS: 0.3 10*3/uL (ref 0.0–0.4)
ABS. IMM. GRANS.: 0 10*3/uL (ref 0.0–0.1)
ABS. MONOCYTES: 0.5 10*3/uL (ref 0.1–0.9)
ABS. NEUTROPHILS: 3.3 10*3/uL (ref 1.4–7.0)
Abs Lymphocytes: 1.9 10*3/uL (ref 0.7–3.1)
BASOPHILS: 1 %
EOSINOPHILS: 5 %
HCT: 48.4 % (ref 37.5–51.0)
HGB: 16.7 g/dL (ref 13.0–17.7)
IMMATURE GRANULOCYTES: 1 %
Lymphocytes: 31 %
MCH: 31 pg (ref 26.6–33.0)
MCHC: 34.5 g/dL (ref 31.5–35.7)
MCV: 90 fL (ref 79–97)
MONOCYTES: 8 %
NEUTROPHILS: 54 %
PLATELET: 183 10*3/uL (ref 150–450)
RBC: 5.38 x10E6/uL (ref 4.14–5.80)
RDW: 12.2 % (ref 11.6–15.4)
WBC: 6 10*3/uL (ref 3.4–10.8)

## 2021-02-19 LAB — METABOLIC PANEL, COMPREHENSIVE
A-G Ratio: 1.8 (ref 1.2–2.2)
ALT (SGPT): 35 IU/L (ref 0–44)
AST (SGOT): 28 IU/L (ref 0–40)
Albumin: 4.5 g/dL (ref 3.8–4.9)
Alk. phosphatase: 71 IU/L (ref 44–121)
BUN/Creatinine ratio: 13 (ref 9–20)
BUN: 18 mg/dL (ref 6–24)
Bilirubin, total: 0.5 mg/dL (ref 0.0–1.2)
CO2: 23 mmol/L (ref 20–29)
Calcium: 9.2 mg/dL (ref 8.7–10.2)
Chloride: 103 mmol/L (ref 96–106)
Creatinine: 1.34 mg/dL — ABNORMAL HIGH (ref 0.76–1.27)
GLOBULIN, TOTAL: 2.5 g/dL (ref 1.5–4.5)
Glucose: 118 mg/dL — ABNORMAL HIGH (ref 70–99)
Potassium: 4.6 mmol/L (ref 3.5–5.2)
Protein, total: 7 g/dL (ref 6.0–8.5)
Sodium: 139 mmol/L (ref 134–144)
eGFR: 62 mL/min/{1.73_m2} (ref >59)

## 2021-02-19 LAB — PSA, DIAGNOSTIC (PROSTATE SPECIFIC AG): Prostate Specific Ag: 0.4 ng/mL (ref 0.0–4.0)

## 2021-02-20 ENCOUNTER — Telehealth

## 2021-02-20 NOTE — Telephone Encounter (Signed)
I called results and advised for now same Lipitor and Zetia and I referred him to Dr Jesse Sans Cards re: elevated Lipids and CAD hx. I advised ADA diet. Call back prn

## 2021-02-22 MED ORDER — EZETIMIBE 10 MG TAB
10 mg | ORAL_TABLET | ORAL | 3 refills | Status: DC
Start: 2021-02-22 — End: 2021-02-27

## 2021-02-27 MED ORDER — EZETIMIBE 10 MG TAB
10 mg | ORAL_TABLET | ORAL | 3 refills | Status: DC
Start: 2021-02-27 — End: 2021-07-24

## 2021-07-24 ENCOUNTER — Ambulatory Visit: Attending: Internal Medicine | Primary: Internal Medicine

## 2021-07-24 ENCOUNTER — Ambulatory Visit: Admit: 2021-07-24 | Attending: Internal Medicine | Primary: Internal Medicine

## 2021-07-24 DIAGNOSIS — E7800 Pure hypercholesterolemia, unspecified: Secondary | ICD-10-CM

## 2021-07-24 MED ORDER — EZETIMIBE 10 MG TAB
10 mg | ORAL_TABLET | ORAL | 0 refills | Status: AC
Start: 2021-07-24 — End: ?

## 2021-07-24 NOTE — Progress Notes (Signed)
Lab only

## 2021-07-26 LAB — LIPID PANEL
Cholesterol, Total: 180 mg/dL (ref 100–199)
Cholesterol, total: 180 mg/dL (ref 100–199)
HDL Cholesterol: 57 mg/dL (ref >39)
HDL: 57 mg/dL (ref >39)
LDL Calculated: 103 mg/dL — ABNORMAL HIGH (ref 0–99)
LDL, calculated: 103 mg/dL — ABNORMAL HIGH (ref 0–99)
Triglyceride: 109 mg/dL (ref 0–149)
Triglycerides: 109 mg/dL (ref 0–149)
VLDL, calculated: 20 mg/dL (ref 5–40)
VLDL: 20 mg/dL (ref 5–40)

## 2021-07-26 LAB — COMPREHENSIVE METABOLIC PANEL
ALT: 51 IU/L — ABNORMAL HIGH (ref 0–44)
AST: 35 IU/L (ref 0–40)
Albumin/Globulin Ratio: 2.2 NA (ref 1.2–2.2)
Albumin: 4.9 g/dL (ref 3.8–4.9)
Alkaline Phosphatase: 81 IU/L (ref 44–121)
BUN: 22 mg/dL (ref 6–24)
Bun/Cre Ratio: 15 NA (ref 9–20)
CO2: 20 mmol/L (ref 20–29)
Calcium: 9.6 mg/dL (ref 8.7–10.2)
Chloride: 102 mmol/L (ref 96–106)
Creatinine: 1.43 mg/dL — ABNORMAL HIGH (ref 0.76–1.27)
Est, Glomerular Filtration Rate: 58 mL/min/{1.73_m2} — ABNORMAL LOW (ref >59)
Globulin, Total: 2.2 g/dL (ref 1.5–4.5)
Glucose: 106 mg/dL — ABNORMAL HIGH (ref 70–99)
Potassium: 4.8 mmol/L (ref 3.5–5.2)
Sodium: 142 mmol/L (ref 134–144)
Total Bilirubin: 1 mg/dL (ref 0.0–1.2)
Total Protein: 7.1 g/dL (ref 6.0–8.5)

## 2021-07-26 LAB — METABOLIC PANEL, COMPREHENSIVE
A-G Ratio: 2.2 (ref 1.2–2.2)
ALT (SGPT): 51 IU/L — ABNORMAL HIGH (ref 0–44)
AST (SGOT): 35 IU/L (ref 0–40)
Albumin: 4.9 g/dL (ref 3.8–4.9)
Alk. phosphatase: 81 IU/L (ref 44–121)
BUN/Creatinine ratio: 15 (ref 9–20)
BUN: 22 mg/dL (ref 6–24)
Bilirubin, total: 1 mg/dL (ref 0.0–1.2)
CO2: 20 mmol/L (ref 20–29)
Calcium: 9.6 mg/dL (ref 8.7–10.2)
Chloride: 102 mmol/L (ref 96–106)
Creatinine: 1.43 mg/dL — ABNORMAL HIGH (ref 0.76–1.27)
GLOBULIN, TOTAL: 2.2 g/dL (ref 1.5–4.5)
Glucose: 106 mg/dL — ABNORMAL HIGH (ref 70–99)
Potassium: 4.8 mmol/L (ref 3.5–5.2)
Protein, total: 7.1 g/dL (ref 6.0–8.5)
Sodium: 142 mmol/L (ref 134–144)
eGFR: 58 mL/min/{1.73_m2} — ABNORMAL LOW (ref >59)

## 2021-07-29 MED ORDER — ALBUTEROL SULFATE HFA 90 MCG/ACTUATION AEROSOL INHALER
90 mcg/actuation | RESPIRATORY_TRACT | 11 refills | Status: AC | PRN
Start: 2021-07-29 — End: ?

## 2021-07-29 NOTE — Telephone Encounter (Signed)
Called results. For now same Atorvastatin and Ezetimibe. I advised that he follow up with [facility] re: lipid tx . Advised also avoid NSAIDS and stay hydrated due to elevated Cr. Has cough now and renewed Albuterol MDI . Follow up prn

## 2021-10-02 ENCOUNTER — Inpatient Hospital Stay: Admit: 2021-10-02 | Discharge: 2021-10-25 | Payer: PRIVATE HEALTH INSURANCE | Primary: Internal Medicine

## 2021-10-02 DIAGNOSIS — Z01818 Encounter for other preprocedural examination: Secondary | ICD-10-CM

## 2021-10-02 LAB — COMPREHENSIVE METABOLIC PANEL
ALT: 48 U/L (ref 12–78)
AST: 37 U/L (ref 15–37)
Albumin/Globulin Ratio: 1.5 (ref 1.1–2.2)
Albumin: 4.1 g/dL (ref 3.5–5.0)
Alk Phosphatase: 73 U/L (ref 45–117)
Anion Gap: 2 mmol/L — ABNORMAL LOW (ref 5–15)
BUN/Creatinine Ratio: 21 — ABNORMAL HIGH (ref 12–20)
BUN: 24 MG/DL — ABNORMAL HIGH (ref 6–20)
CO2: 29 mmol/L (ref 21–32)
Calcium: 8.8 MG/DL (ref 8.5–10.1)
Chloride: 108 mmol/L (ref 97–108)
Creatinine: 1.17 MG/DL (ref 0.70–1.30)
Est, Glom Filt Rate: >60 mL/min/{1.73_m2} (ref >60)
Globulin: 2.8 g/dL (ref 2.0–4.0)
Glucose: 107 mg/dL — ABNORMAL HIGH (ref 65–100)
Potassium: 4.5 mmol/L (ref 3.5–5.1)
Sodium: 139 mmol/L (ref 136–145)
Total Bilirubin: 1 MG/DL (ref 0.2–1.0)
Total Protein: 6.9 g/dL (ref 6.4–8.2)

## 2021-10-02 NOTE — Other (Signed)
[facility]                   [address] [place], [place] [address]   PRE-ADMISSION TESTING    [phone]     Surgery Date:  Wednesday October 09, 2021       Is surgery arrival time given by surgeon?  YES  NO  If "NO", [facility] staff will call you between 3 and 7pm the day before your surgery with your arrival time. (If your surgery is on a Monday, we will call you the Friday before.)    Call [phone] after 7pm Monday-Friday if you did not receive this call.    INSTRUCTIONS BEFORE YOUR SURGERY   When You  Arrive Arrive at the 2nd Floor Admitting Desk on the day of your surgery  Have your insurance card, photo ID, and any copayment (if needed)   Food   and   Drink NO food or drink after midnight the night before surgery    This means NO water, gum, mints, coffee, juice, etc.  No alcohol (beer, wine, liquor) 24 hours before and after surgery   Medications to   TAKE   Morning of Surgery MEDICATIONS TO TAKE THE MORNING OF SURGERY WITH A SIP OF WATER:     Gabapentin, Cetirizine, Atorvastatin  Use Albuterol inhaler and Flonase spray if needed    Do NOT take Ezetimibe the day before surgery or the day of surgery.   Medications  To  STOP      7 days before surgery Non-Steroidal anti-inflammatory Drugs (NSAID's): for example, Ibuprofen (Advil, Motrin), Naproxen (Aleve)  Aspirin, if taking for pain   Herbal supplements, vitamins, and fish oil  (Pain medications not listed above, including Tylenol may be taken)   Blood  Thinners If you take  Aspirin, Plavix, Coumadin, or any blood-thinning or anti-blood clot medicine, talk to the doctor who prescribed the medications for pre-operative instructions. Med plan for aspirin instructions to be faxed to Dr. Maisie Fus. We will notify you.    Bathing Clothing  Jewelry  Valuables     If you shower the morning of surgery, please do not apply anything to your skin (lotions, powders, deodorant)  Follow Chlorhexidine [employer] body wash instructions provided  to you during PAT appointment. Begin 3 days prior to surgery.  Do not shave or trim anywhere 24 hours before surgery  Wear loose, comfortable, clean clothes  Bring reading glasses  Leave money, valuables, and jewelry, including body piercings, at home     Going Home - or Spending the Night SAME-DAY SURGERY: You must have a responsible adult drive you home and stay with you 24 hours after surgery   Special Instructions Free valet parking is available at the main front entrance of the hospital Monday-Friday 7a-5p.        Follow all instructions so your surgery won't be cancelled.  Please, be on time.                    If a situation occurs and you are delayed the day of surgery, call  [phone].    If your physical condition changes (like a fever, cold, flu, etc.) call your surgeon.    Home medication(s) reviewed and verified via   LIST   VERBAL   during PAT appointment.    The patient was contacted by   IN-PERSON  The patient verbalizes understanding of all instructions and  DOES NOT   need reinforcement.

## 2021-10-02 NOTE — Other (Signed)
Med plan faxed this morning to Dr. Maisie Fus for aspirin instructions. Patient notified by voice mail after plan was received in PAT and instructed to hold aspirin for 5 days prior to surgery. Last dose today until cleared to resume after surgery. DOS 10/09/21

## 2021-10-02 NOTE — H&P (Signed)
Jason Shah was referred for evaluation by:Dr. Claudean Shah for Pre- Op Evaluation.  Please see encounter details and orders for consultative summary.    Type of surgery : Left Ulnar Nerve Release with Transposition and Left Carpal Tunnel Release  Surgery site : Left elbow, Left wrist  Anesthesia type: Regional block  Date of procedure:  10/10/2021    This 56 y.o. year old male presents with complaints of numbness/tingling left hand for the past 2+ years.  Now having pain and weakness in left hand; he is left hand dominant. Pain has been disturbing his sleep.  Conservative measures including splinting have been exhausted prior to the decision for surgery. Patient has discussed the risks, alternatives, and benefits of the surgery with surgeon and has elected to proceed with surgical intervention.    Significant cardiac history of MI in 2013 with placement of 2 stents; has bicuspid aortic valve and had Jason Shah procedure in 2015 for thoracic aneurysm.  Sees cardiology every 6 months.  Denies chest pain or shortness of breath on exertion.    Allergies:   Allergies   Allergen Reactions    Rosuvastatin Myalgia     Latex allergy: no  Prior reactions to anesthesia:  None     Current Outpatient Medications   Medication Sig    Fluticasone Propionate (FLONASE NA) by Nasal route as needed    Acetaminophen (TYLENOL EXTRA STRENGTH PO) Take by mouth as needed    aspirin 81 MG chewable tablet Take 2 tablets by mouth every morning    atorvastatin (LIPITOR) 80 MG tablet Take 1 tablet by mouth every morning    Cetirizine HCl (ZYRTEC ALLERGY) 10 MG CAPS Take 10 mg by mouth every morning    ezetimibe (ZETIA) 10 MG tablet 1 tablet every morning    Famotidine-Ca Carb-Mag Hydrox (PEPCID COMPLETE) 10-800-165 MG CHEW Take by mouth daily as needed    gabapentin (NEURONTIN) 300 MG capsule Take 1 capsule by mouth 2 times daily.    Naproxen Sodium 220 MG CAPS Take by mouth as needed    tiZANidine (ZANAFLEX) 2 MG capsule Take 1 capsule by  mouth nightly    albuterol sulfate HFA (PROAIR HFA) 108 (90 Base) MCG/ACT inhaler INHALE 2 PUFFS BY MOUTH EVERY 4 HOURS AS NEEDED FOR WHEEZING     No current facility-administered medications for this encounter.     Past Medical History:   Diagnosis Date    Acute anterior wall MI (HCC) 06/24/2011    06/21/11. In [place]. Cath:   LAD obstruction   ; tx: 2 stents. Also has obstruction  in  2nd marginal of the LCX  Proximal aortic root dilatation.  Several days after cath: Echo: EF 49 %. .      Allergic rhinitis     Aneurysm of aorta (HCC) 08/2013    Jason Shah Procedure    Arthritis     Cervical spondylosis    Asthma     exercise induced as child; RAD while ill    Bicuspid aortic valve 08/01/2013    Bronchitis 12/30/2010    CAD (coronary artery disease)     Cervical disc disease     C5-C6 disc disease.    Depression 12/30/2010    Eczema 12/30/2010    Hypercholesterolemia 06/24/2011    MI (myocardial infarction) (HCC) 2014    OSA on CPAP     Pneumonia     several episodes in childhood.      Past Surgical History:   Procedure Laterality  Date    CARDIAC CATHETERIZATION      also separately, Bicuspid Aortic Valve repair. Thoracic Aortic Aneursym repair 2015 [facility]     COLONOSCOPY      age 56 . results negative . f/u 10 years     LIPOMA RESECTION Bilateral     removal of lipomas bilateral arms    ORTHOPEDIC SURGERY  11/2019    rt rtc repair . rt long head bicep sgy repair     ORTHOPEDIC SURGERY  1994    rt shoulder bankhardt sgy     ORTHOPEDIC SURGERY  07/2019    rightknee arthroscopic Meniscectomy     ORTHOPEDIC SURGERY  02/2019    Left knee unicompartmental replacectomy Dr. Evalyn Shah    ORTHOPEDIC SURGERY  11/2015    Left Knee arthroscopic Meniscectomy     OTHER SURGICAL HISTORY      removal of cysts    UROLOGICAL SURGERY      vasectomy; vasectomy reversal .    WISDOM TOOTH EXTRACTION       Social History     Tobacco Use    Smoking status: Never    Smokeless tobacco: Current     Types: Chew    Tobacco comments:     Dips     Vaping Use    Vaping Use: Never used   Substance Use Topics    Alcohol use: Yes     Alcohol/week: 2.0 standard drinks     Comment: 6-10 glasses of wine weekly    Drug use: Never     Family History   Problem Relation Age of Onset    Asthma Brother     Asthma Mother     Heart Disease Father         Pre-Operative Risk Assessment Using 2014 ACC/AHA Guidelines     Emergent procedure: No  Active Cardiac Condition including decompensated HF, Arrhythmia, MI <3 weeks, severe valve disease: No  Risk Level of Procedure: intermediate risk  Revised Cardiac Risk Index Risk factors: History of ischemic heart disease (+1), History of heart failure (+1), and Total Score 2  10.0% risk of cardiac complications during or around noncardiac surgery (30 day risk of Death, MI or Cardiac arrest).  Measurement of Exercise Tolerance before Surgery >4 METS (climbing > 1 flight of stairs without stopping, walking up hill > 1-2 blocks, scrubbing floors, moving furniture, golf, bowling, dancing or tennis, or running short distance): Yes    History of cardiac (including arrhythmic or valvular) or lung issues? Yes  Personal or family of bleeding issues?  No  Hx of HTN?  No        Controlled?  N/A     Smoker? No; dips tobacco daily  Etoh or other substance use? 6-10 glasses of wine weekly     On anticoagulation, insulin, or steroids?  No  Any concern with current medications?  No  Pacemaker present? No        and if so, has it been interrogated in the last 12 months?  N/A  Known OSA?  Yes -- uses CPAP  Known liver disease?  No    Blood pressure 108/79, pulse 66, temperature 96.9 F (36.1 C), temperature source Temporal, resp. rate 16, height 1.778 m (5\' 10" ), weight 104.6 kg (230 lb 9.6 oz), SpO2 98 %.    Review of Systems  Review of Systems   Constitutional:  Negative for chills, fatigue and fever.   HENT:  Negative for congestion, hearing loss, sore throat  and trouble swallowing.    Eyes:  Negative for visual disturbance.   Respiratory:   Negative for cough, shortness of breath and wheezing.    Cardiovascular:  Negative for chest pain, palpitations and leg swelling.   Gastrointestinal:  Negative for abdominal pain, blood in stool, nausea and vomiting.   Genitourinary:  Negative for dysuria, frequency and urgency.   Musculoskeletal:  Positive for arthralgias (left wrist/hand).   Skin:  Negative for rash and wound.   Neurological:  Positive for numbness (left hand). Negative for dizziness, light-headedness and headaches.   Psychiatric/Behavioral:  Negative for dysphoric mood. The patient is not nervous/anxious.         Physical Exam  Vitals and nursing note reviewed. Exam conducted with a chaperone present.   Constitutional:       Appearance: Normal appearance.   HENT:      Head: Normocephalic and atraumatic.      Right Ear: External ear normal.      Left Ear: External ear normal.      Nose: Nose normal.      Mouth/Throat:      Mouth: Mucous membranes are moist.      Pharynx: Oropharynx is clear.   Eyes:      Extraocular Movements: Extraocular movements intact.   Cardiovascular:      Rate and Rhythm: Normal rate and regular rhythm.      Pulses: Normal pulses.      Heart sounds: Normal heart sounds.   Pulmonary:      Effort: Pulmonary effort is normal.      Breath sounds: Normal breath sounds. No wheezing or rhonchi.   Abdominal:      General: Bowel sounds are normal.      Palpations: Abdomen is soft.      Tenderness: There is no abdominal tenderness.   Musculoskeletal:         General: Normal range of motion.      Cervical back: Normal range of motion and neck supple.   Skin:     General: Skin is warm and dry.      Findings: No lesion or rash.   Neurological:      Mental Status: He is alert and oriented to person, place, and time.      Comments: Positive tinel's left wrist and left elbow   Psychiatric:         Mood and Affect: Mood normal.         Behavior: Behavior normal.        Assessment  Left Carpal tunnel syndrome  Left Cubital tunnel  syndrome  Preoperative evaluation    Plan  Labs and EKG pending  Plan for Left Ulnar Nerve Release with Transposition and Left Carpal Tunnel Release  Surgeon has requested Cardiac clearance for upcoming surgery; patient notified    According to the 2014 ACC/AHA pre-operative risk assessment guidelines Jason Shah is at elevated risk for major cardiac complications during a intermediate procedure, exercise tolerance is >4 METS  and further evaluation may be indicated .     Request further recommendations from consultants: Cardiology

## 2021-10-02 NOTE — Other (Signed)
EKG from pre admission faxed to Dr. Jesse Sans' for review.

## 2021-10-02 NOTE — Other (Signed)
Left message at [facility] for Dr. Marijo Conception' nurse to see if EKG that was faxed on Friday was received and if the patient is still cleared to proceed with surgery.

## 2021-10-02 NOTE — Other (Signed)
Last office note and EKG requested from [facility].

## 2021-10-02 NOTE — Other (Signed)
EKG that was received from cardiologist office from 03/11/21 was shown to Jason Shah to compare to EKG done during PAT visit on 10/02/21.  No changes identified.  Letter of clearance for surgery on chart received from cardiologist.

## 2021-10-03 LAB — EKG 12-LEAD
Atrial Rate: 59 {beats}/min
P Axis: 40 degrees
P-R Interval: 174 ms
Q-T Interval: 420 ms
QRS Duration: 92 ms
QTc Calculation (Bazett): 415 ms
R Axis: 26 degrees
T Axis: 128 degrees
Ventricular Rate: 59 {beats}/min

## 2021-10-08 NOTE — Other (Signed)
Hello,     You are scheduled to have surgery tomorrow at [facility].     We would like for you to arrive at  05:30 am  We are located on the second floor, [address]. You will check-in at the registration desk located outside the elevators on the second floor prior to proceeding to [address].  Remember nothing to eat or drink after midnight. If you need to take medications the morning of surgery, please take with a few sips of water.   Wear loose, comfortable clothing and leave all your jewelry at home.   You may bring your cell phone with you.  One family member will be allowed in the pre-op area once you are dressed and your IV has been started.   You will need someone to drive you home and be with you for 24 hours post-anesthesia.     We look forward to seeing you! Call [phone] for questions after hours and [phone] between 5:30AM and 6PM.     Thanks!    [facility] PREOP TEAM

## 2021-10-09 ENCOUNTER — Inpatient Hospital Stay: Payer: PRIVATE HEALTH INSURANCE | Attending: Orthopaedic Surgery

## 2021-10-09 MED ORDER — CEFAZOLIN SODIUM 1 G IJ SOLR
1 g | Freq: Once | INTRAMUSCULAR | Status: AC
Start: 2021-10-09 — End: 2021-10-09
  Administered 2021-10-09: 12:00:00 2000 mg via INTRAVENOUS

## 2021-10-09 MED ORDER — MEPIVACAINE HCL (PF) 1.5 % IJ SOLN
1.5 % | INTRAMUSCULAR | Status: AC
Start: 2021-10-09 — End: 2021-10-09
  Administered 2021-10-09: 11:00:00 10 via PERINEURAL

## 2021-10-09 MED ORDER — HYDROMORPHONE 0.5MG/0.5ML IJ SOLN
1 MG/ML | Status: DC | PRN
Start: 2021-10-09 — End: 2021-10-09

## 2021-10-09 MED ORDER — LIDOCAINE HCL (PF) 1 % IJ SOLN
1 % | INTRAMUSCULAR | Status: AC
Start: 2021-10-09 — End: ?

## 2021-10-09 MED ORDER — GLYCOPYRROLATE 0.2 MG/ML IJ SOLN
0.2 MG/ML | INTRAMUSCULAR | Status: DC | PRN
Start: 2021-10-09 — End: 2021-10-09
  Administered 2021-10-09: 12:00:00 .2 via INTRAVENOUS

## 2021-10-09 MED ORDER — MIDAZOLAM HCL (PF) 2 MG/2ML IJ SOLN
2 MG/ML | Freq: Once | INTRAMUSCULAR | Status: AC | PRN
Start: 2021-10-09 — End: 2021-10-09
  Administered 2021-10-09 (×2): 2 mg via INTRAVENOUS

## 2021-10-09 MED ORDER — PROPOFOL 500 MG/50ML IV EMUL
500 MG/50ML | INTRAVENOUS | Status: AC
Start: 2021-10-09 — End: ?

## 2021-10-09 MED ORDER — DIPHENHYDRAMINE HCL 50 MG/ML IJ SOLN
50 MG/ML | Freq: Once | INTRAMUSCULAR | Status: DC | PRN
Start: 2021-10-09 — End: 2021-10-09

## 2021-10-09 MED ORDER — ONDANSETRON HCL 4 MG/2ML IJ SOLN
4 MG/2ML | Freq: Once | INTRAMUSCULAR | Status: DC | PRN
Start: 2021-10-09 — End: 2021-10-09

## 2021-10-09 MED ORDER — PROPOFOL 500 MG/50ML IV EMUL
500 MG/50ML | INTRAVENOUS | Status: DC | PRN
Start: 2021-10-09 — End: 2021-10-09
  Administered 2021-10-09: 12:00:00 50 via INTRAVENOUS

## 2021-10-09 MED ORDER — BACITRACIN ZINC 500 UNIT/GM EX OINT
500 UNIT/GM | CUTANEOUS | Status: DC | PRN
Start: 2021-10-09 — End: 2021-10-09
  Administered 2021-10-09: 12:00:00 1 via TOPICAL

## 2021-10-09 MED ORDER — DEXMEDETOMIDINE HCL 200 MCG/2ML IV SOLN
200 MCG/2ML | INTRAVENOUS | Status: DC | PRN
Start: 2021-10-09 — End: 2021-10-09
  Administered 2021-10-09 (×2): 10 via INTRAVENOUS

## 2021-10-09 MED ORDER — FENTANYL CITRATE (PF) 100 MCG/2ML IJ SOLN
100 MCG/2ML | Freq: Once | INTRAMUSCULAR | Status: AC | PRN
Start: 2021-10-09 — End: 2021-10-09
  Administered 2021-10-09 (×2): 50 ug via INTRAVENOUS

## 2021-10-09 MED ORDER — MIDAZOLAM HCL 2 MG/2ML IJ SOLN
2 MG/ML | INTRAMUSCULAR | Status: AC
Start: 2021-10-09 — End: ?

## 2021-10-09 MED ORDER — ROPIVACAINE HCL 5 MG/ML IJ SOLN
INTRAMUSCULAR | Status: AC
Start: 2021-10-09 — End: 2021-10-09
  Administered 2021-10-09: 11:00:00 20 via PERINEURAL

## 2021-10-09 MED ORDER — LACTATED RINGERS IV SOLN
INTRAVENOUS | Status: DC
Start: 2021-10-09 — End: 2021-10-09
  Administered 2021-10-09: 11:00:00 via INTRAVENOUS

## 2021-10-09 MED ORDER — BUPIVACAINE HCL (PF) 0.5 % IJ SOLN
0.5 % | INTRAMUSCULAR | Status: AC
Start: 2021-10-09 — End: ?

## 2021-10-09 MED ORDER — LACTATED RINGERS IV SOLN
INTRAVENOUS | Status: DC
Start: 2021-10-09 — End: 2021-10-09

## 2021-10-09 MED ORDER — GLYCOPYRROLATE 0.2 MG/ML IJ SOLN
0.2 MG/ML | INTRAMUSCULAR | Status: AC
Start: 2021-10-09 — End: ?

## 2021-10-09 MED ORDER — DEXMEDETOMIDINE HCL 200 MCG/2ML IV SOLN
200 MCG/2ML | INTRAVENOUS | Status: AC
Start: 2021-10-09 — End: ?

## 2021-10-09 MED ORDER — LIDOCAINE HCL (PF) 1 % IJ SOLN
1 % | Freq: Once | INTRAMUSCULAR | Status: DC | PRN
Start: 2021-10-09 — End: 2021-10-09

## 2021-10-09 MED ORDER — BACITRACIN 500 UNIT/GM EX OINT
500 UNIT/GM | CUTANEOUS | Status: AC
Start: 2021-10-09 — End: ?

## 2021-10-09 MED FILL — MIDAZOLAM HCL 2 MG/2ML IJ SOLN: 2 MG/ML | INTRAMUSCULAR | Qty: 2

## 2021-10-09 MED FILL — FENTANYL CITRATE (PF) 100 MCG/2ML IJ SOLN: 100 MCG/2ML | INTRAMUSCULAR | Qty: 2

## 2021-10-09 MED FILL — DEXMEDETOMIDINE HCL 200 MCG/2ML IV SOLN: 200 MCG/2ML | INTRAVENOUS | Qty: 2

## 2021-10-09 MED FILL — CEFAZOLIN SODIUM 1 G IJ SOLR: 1 g | INTRAMUSCULAR | Qty: 2000

## 2021-10-09 MED FILL — BACITRACIN 500 UNIT/GM EX OINT: 500 UNIT/GM | CUTANEOUS | Qty: 1

## 2021-10-09 MED FILL — LIDOCAINE HCL (PF) 1 % IJ SOLN: 1 % | INTRAMUSCULAR | Qty: 5

## 2021-10-09 MED FILL — LACTATED RINGERS IV SOLN: INTRAVENOUS | Qty: 1000

## 2021-10-09 MED FILL — GLYCOPYRROLATE 0.2 MG/ML IJ SOLN: 0.2 MG/ML | INTRAMUSCULAR | Qty: 1

## 2021-10-09 MED FILL — DIPRIVAN 500 MG/50ML IV EMUL: 500 MG/50ML | INTRAVENOUS | Qty: 50

## 2021-10-09 MED FILL — BUPIVACAINE HCL (PF) 0.5 % IJ SOLN: 0.5 % | INTRAMUSCULAR | Qty: 10

## 2021-10-09 NOTE — Anesthesia Pre-Procedure Evaluation (Signed)
Department of Anesthesiology  Preprocedure Note       Name:  Jason Shah   Age:  56 y.o.  DOB:  July 01, 1965                                          MRN:  [id number]         Date:  10/09/2021      Surgeon: Juliann Mule):  [name], MD    Procedure: Procedure(s):  LEFT ULNAR NERVE RELEASE WITH TRANSPOSITION AND LEFT CARPAL TUNNEL RELEASE    Medications prior to admission:   Prior to Admission medications    Medication Sig Start Date End Date Taking? Authorizing Provider   Fluticasone Propionate (FLONASE NA) by Nasal route as needed    Historical Provider, MD   Acetaminophen (TYLENOL EXTRA STRENGTH PO) Take by mouth as needed    Historical Provider, MD   aspirin 81 MG chewable tablet Take 2 tablets by mouth every morning    Ar Automatic Reconciliation   atorvastatin (LIPITOR) 80 MG tablet Take 1 tablet by mouth every morning 03/31/16   Ar Automatic Reconciliation   Cetirizine HCl (ZYRTEC ALLERGY) 10 MG CAPS Take 10 mg by mouth every morning    Ar Automatic Reconciliation   ezetimibe (ZETIA) 10 MG tablet 1 tablet every morning 02/27/21   Ar Automatic Reconciliation   Famotidine-Ca Carb-Mag Hydrox (PEPCID COMPLETE) 10-800-165 MG CHEW Take by mouth daily as needed    Ar Automatic Reconciliation   gabapentin (NEURONTIN) 300 MG capsule Take 1 capsule by mouth 2 times daily.    Ar Automatic Reconciliation   Naproxen Sodium 220 MG CAPS Take by mouth as needed    Ar Automatic Reconciliation   tiZANidine (ZANAFLEX) 2 MG capsule Take 1 capsule by mouth nightly    Ar Automatic Reconciliation   albuterol sulfate HFA (PROVENTIL;VENTOLIN;PROAIR) 108 (90 Base) MCG/ACT inhaler INHALE 2 PUFFS BY MOUTH EVERY 4 HOURS AS NEEDED FOR WHEEZING 03/21/20   Ar Automatic Reconciliation       Current medications:    Current Facility-Administered Medications   Medication Dose Route Frequency Provider Last Rate Last Admin   . lidocaine PF 1 % injection 1 mL  1 mL IntraDERmal Once PRN Danella Sensing, MD       . fentaNYL (SUBLIMAZE) injection  100 mcg  100 mcg IntraVENous Once PRN Danella Sensing, MD       . lactated ringers IV soln infusion   IntraVENous Continuous Danella Sensing, MD 125 mL/hr at 10/09/21 0703 New Bag at 10/09/21 0703   . midazolam PF (VERSED) injection 2 mg  2 mg IntraVENous Once PRN Danella Sensing, MD       . ceFAZolin (ANCEF) 2,000 mg in sterile water 20 mL IV syringe  2,000 mg IntraVENous Once [name], MD           Allergies:    Allergies   Allergen Reactions   . Rosuvastatin Myalgia       Problem List:    Patient Active Problem List   Diagnosis Code   . Eczema L30.9   . Bronchitis J40   . Thoracic aortic aneurysm (HCC) I71.20   . 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 Bicuspid aortic valve Q23.1   . Hypercholesterolemia E78.00   . Allergic rhinitis J30.9   . Depression F32.A  Past Medical History:        Diagnosis Date   . Acute anterior wall MI ([place]) 06/24/2011    06/21/11. In [place]. Cath:   LAD obstruction   ; tx: 2 stents. Also has obstruction  in  2nd marginal of the LCX  Proximal aortic root dilatation.  Several days after cath: Echo: EF 49 %. .     . Allergic rhinitis    . Aneurysm of aorta ([place]) 08/2013    [facility] Procedure   . Arthritis     Cervical spondylosis   . Asthma     exercise induced as child; RAD while ill   . Bicuspid aortic valve 08/01/2013   . Bronchitis 12/30/2010   . CAD (coronary artery disease)    . Cervical disc disease     C5-C6 disc disease.   . Depression 12/30/2010   . Eczema 12/30/2010   . Hypercholesterolemia 06/24/2011   . MI (myocardial infarction) ([place]) 2014   . OSA on CPAP    . Pneumonia     several episodes in childhood.        Past Surgical History:        Procedure Laterality Date   . CARDIAC CATHETERIZATION  2013    stents x 2   . COLONOSCOPY      age 62 . results negative . f/u 10 years    . JOINT REPLACEMENT Left 02/2019    partial knee replacement   . KNEE ARTHROSCOPY W/ MENISCECTOMY Left 11/2015   . KNEE ARTHROSCOPY W/ MENISCECTOMY Right 07/2019   . LIPOMA RESECTION  Bilateral     removal of lipomas bilateral arms   . ROTATOR CUFF REPAIR Right 11/2019    w/ right long head biceps repair   . SHOULDER SURGERY Right 1994    Right bankhardt   . THORACIC AORTIC ANEURYSM REPAIR  2015    Had Shanon Brow Procedure at [facility]   . VASECTOMY     . VASECTOMY REVERSAL     . WISDOM TOOTH EXTRACTION         Social History:    Social History     Tobacco Use   . Smoking status: Never   . Smokeless tobacco: Current     Types: Chew   . Tobacco comments:     Dips    Substance Use Topics   . Alcohol use: Yes     Alcohol/week: 2.0 standard drinks     Comment: 6-10 glasses of wine weekly                                Ready to quit: Not Answered  Counseling given: Not Answered  Tobacco comments: Dips       Vital Signs (Current):   Vitals:    10/09/21 0605 10/09/21 0630   BP:  129/79   Pulse:  52   Resp:  10   Temp:  98.3 F (36.8 C)   TempSrc:  Oral   SpO2:  96%   Weight: 104.8 kg (231 lb 0.7 oz)    Height: 1.727 m (_0 )                                               BP Readings from Last 3 Encounters:   10/09/21 129/79  10/02/21 108/79   02/18/21 122/82       NPO Status: Time of last liquid consumption: 2200                        Time of last solid consumption: 2000                        Date of last liquid consumption: 10/08/21                        Date of last solid food consumption: 10/08/21    BMI:   Wt Readings from Last 3 Encounters:   10/09/21 104.8 kg (231 lb 0.7 oz)   10/02/21 104.6 kg (230 lb 9.6 oz)   02/18/21 105.7 kg (233 lb)     Body mass index is 35.13 kg/m.    CBC:   Lab Results   Component Value Date/Time    WBC 6.0 02/18/2021 10:31 AM    RBC 5.38 02/18/2021 10:31 AM    HGB 16.7 02/18/2021 10:31 AM    HCT 48.4 02/18/2021 10:31 AM    MCV 90 02/18/2021 10:31 AM    MCV 91.1 05/26/2018 03:52 PM    RDW 12.2 02/18/2021 10:31 AM    PLT 183 02/18/2021 10:31 AM       CMP:   Lab Results   Component Value Date/Time    NA 139 10/02/2021 10:09 AM    K 4.5 10/02/2021 10:09 AM    CL 108  10/02/2021 10:09 AM    CO2 29 10/02/2021 10:09 AM    BUN 24 10/02/2021 10:09 AM    CREATININE 1.17 10/02/2021 10:09 AM    GFRAA 87 02/15/2020 02:15 PM    AGRATIO 2.2 07/24/2021 09:28 AM    LABGLOM >60 10/02/2021 10:09 AM    LABGLOM 58 07/24/2021 09:28 AM    GLUCOSE 107 10/02/2021 10:09 AM    PROT 6.9 10/02/2021 10:09 AM    CALCIUM 8.8 10/02/2021 10:09 AM    BILITOT 1.0 10/02/2021 10:09 AM    ALKPHOS 73 10/02/2021 10:09 AM    ALKPHOS 81 07/24/2021 09:28 AM    AST 37 10/02/2021 10:09 AM    ALT 48 10/02/2021 10:09 AM       POC Tests: No results for input(s): POCGLU, POCNA, POCK, POCCL, POCBUN, POCHEMO, POCHCT in the last 72 hours.    Coags: No results found for: PROTIME, INR, APTT    HCG (If Applicable): No results found for: PREGTESTUR, PREGSERUM, HCG, HCGQUANT     ABGs: No results found for: PHART, PO2ART, PCO2ART, HCO3ART, BEART, O2SATART     Type & Screen (If Applicable):  No results found for: LABABO, LABRH    Drug/Infectious Status (If Applicable):  No results found for: HIV, HEPCAB    COVID-19 Screening (If Applicable): No results found for: COVID19        Anesthesia Evaluation  Patient summary reviewed and Nursing notes reviewed  Airway: Mallampati: III  TM distance: >3 FB   Neck ROM: full  Mouth opening: > = 3 FB   Dental:          Pulmonary:normal exam  breath sounds clear to auscultation  (+) pneumonia:  sleep apnea:  asthma:                            Cardiovascular:  Exercise tolerance: good (>4 METS),   (+)  past MI:, CAD: obstructive, CABG/stent:,       ECG reviewed  Rhythm: regular  Rate: normal  Echocardiogram reviewed                  Neuro/Psych:               GI/Hepatic/Renal:             Endo/Other:                     Abdominal:             Vascular:          Other Findings:           Anesthesia Plan      regional, TIVA and MAC     ASA 3       Induction: intravenous.    MIPS: Postoperative opioids intended and Prophylactic antiemetics administered.  Anesthetic plan and risks discussed with  patient.        Attending anesthesiologist reviewed and agrees with Preprocedure content                Cristy Folks, MD   10/09/2021

## 2021-10-09 NOTE — Anesthesia Procedure Notes (Signed)
Peripheral Block    Patient location during procedure: pre-op  Reason for block: procedure for pain, post-op pain management and at surgeon's request  Start time: 10/09/2021 7:17 AM  End time: 10/09/2021 7:24 AM  Staffing  Performed: anesthesiologist   Anesthesiologist: Kellie Simmering, MD  Preanesthetic Checklist  Completed: patient identified, IV checked, site marked, risks and benefits discussed, surgical/procedural consents, equipment checked, pre-op evaluation, timeout performed, anesthesia consent given, oxygen available and monitors applied/VS acknowledged  Peripheral Block   Patient position: supine  Prep: ChloraPrep  Provider prep: mask  Patient monitoring: continuous pulse ox, IV access, oxygen and responsive to questions  Block type: Brachial plexus  Supraclavicular  Laterality: left  Injection technique: single-shot  Guidance: ultrasound guided    Needle   Needle type: short-bevel   Needle gauge: 20 G  Needle localization: anatomical landmarks and ultrasound guidance  Needle length: 5 cm  Assessment   Injection assessment: negative aspiration for heme, no paresthesia on injection, local visualized surrounding nerve on ultrasound and no intravascular symptoms  Paresthesia pain: none  Slow fractionated injection: yes  Hemodynamics: stable  Real-time [place] image taken/store: yes  Outcomes: uncomplicated and patient tolerated procedure well    Additional Notes  The surgeon has consulted the department of regional anesthesia to place a peripheral nerve block for post-operative pain management for this patient. If a PNB catheter is placed, infusion is anticipated for up to 72 hours post-operatively unless otherwise discussed.     Prior to the above procedure, the alternatives, risks, side effects, and potential complications (including anesthetic toxicity, seizures, death, temporary and/or permanent nerve injury, and loss of extremity function) of the planned single injection and/or continuous peripheral nerve  block. All were thoroughly discussed, and the patient and/or guardian acknowledge and accept all risks of the planned procedure.    Block completed under direct U/S guidance. Frequent negative aspiration during injection. No pain on injection, low injection pressure, no paresthesia, no evidence of hematoma, and no other complications unless otherwise noted.    PERFORMED BLOCKS:    SUPRACLAVICULAR PERIPHERAL NERVE BLOCK      Medications Administered  mepivacaine (CARBOCAINE) injection 1.5% - Perineural   10 mL - 10/09/2021 7:17:00 AM  ropivacaine (NAROPIN) injection 0.5% - Perineural   20 mL - 10/09/2021 7:25:00 AM

## 2021-10-09 NOTE — Brief Op Note (Cosign Needed)
Brief Postoperative Note      Patient: Jason Shah  Date of Birth: [birth year]  MRN: [id number]    Date of Procedure: 10/30/2021    Pre-Op Diagnosis Codes:     * Cubital tunnel syndrome, unspecified laterality [G56.20]     * Bilateral carpal tunnel syndrome [G56.03]    Post-Op Diagnosis: Same       Procedure(s):  LEFT ULNAR NERVE RELEASE WITH TRANSPOSITION AND LEFT CARPAL TUNNEL RELEASE    Surgeon(s):  Lossie Faes, MD    Assistant:  Physician Assistant: Smitty Pluck, PA    Anesthesia: Regional    Estimated Blood Loss (mL): Minimal    Complications: None    Specimens:   * No specimens in log *    Implants:  * No implants in log *      Drains: * No LDAs found *    Findings: Left cubital and carpal tunnel      Electronically signed by Smitty Pluck, PA on 30-Oct-2021 at 10:45 AM

## 2021-10-09 NOTE — Anesthesia Post-Procedure Evaluation (Signed)
Department of Anesthesiology  Postprocedure Note    Patient: Jason Shah  MRN: [id number]  Birthdate: 04/25/1965  Date of evaluation: 10/09/2021      Procedure Summary     Date: 10/09/21 Room / Location: [facility] OR C2 / [facility]    Anesthesia Start: 8309 Anesthesia Stop: 0927    Procedure: LEFT ULNAR NERVE RELEASE WITH TRANSPOSITION AND LEFT CARPAL TUNNEL RELEASE (Left: Arm Lower) Diagnosis:       Cubital tunnel syndrome, unspecified laterality      Bilateral carpal tunnel syndrome      (Cubital tunnel syndrome, unspecified laterality [G56.20])      (Bilateral carpal tunnel syndrome [G56.03])    Surgeons: [name], MD Responsible Provider: Cristy Folks, MD    Anesthesia Type: regional, TIVA, MAC ASA Status: 3          Anesthesia Type: No value filed.    Aldrete Phase I:      Aldrete Phase II: Aldrete Score: 8      Anesthesia Post Evaluation    Patient location during evaluation: bedside  Airway patency: patent  Nausea & Vomiting: no nausea and no vomiting  Complications: no  Cardiovascular status: hemodynamically stable  Respiratory status: acceptable  Comments: VITALS REVIEWED

## 2021-10-09 NOTE — Discharge Instructions (Signed)
SEE DR Kendra Opitz PRE-PRINTED INSTRUCTIONS  (COPY MADE TO PAPER CHART RECORD)    ___________________________________________          DISCHARGE SUMMARY from Your Nurse    PATIENT INSTRUCTIONS:    AFTER ANESTHESIA & SEDATION, and WHILE TAKING PAIN MEDICINE  After general anesthesia / intravenous sedation and the 24 hours following, and/or while taking prescription Opiates:  Limit your activities  Do not drive and operate hazardous machinery until you have been of all narcotics and sedatives for over 24 hours  Do not make important personal or business decisions  Do  not drink alcoholic beverages  If you have not urinated within 8 hours after discharge, please contact your surgeon on call.        SIGNS OF INFECTION, THINGS TO REPORT TO YOUR DOCTOR  Report the following Signs of Infection or General Problems after surgery to your surgeon:  Redness, swelling, drainage, pus or odor of or around the surgical area  Excessive pain not relieved, or discomfort not improved by the medications prescribed by your doctor  Fever/ temperature over 101; Temperature over 100 if on medications (chemotherapy or medicines which affect your ability to fight infections)  Nausea and vomiting lasting longer than 4 hours or if unable to take medications  Any signs of decreased circulation or nerve impairment to extremity: change in color, persistent  numbness, tingling, coldness or increase pain  If you have any questions.      GOOD HELP TO FIGHT AN INFECTION  Here are a few tip to help reduce the chance of getting an infection after surgery:  Wash Your Hands  Good handwashing is the most important thing you and your caregiver can do.  Wash before and after caring for any wounds.  Dry your hand with a clean towel.  Wash with soap and water for at least 20 seconds. A TIP: sing the "Happy Birthday" song through one time while washing to help with the timing.  Use a hand sanitizer in between washings.    Shower  When your surgeon says it is OK  to take a shower, use a new bar of antibacterial soap (if that is what you use, and keep that bar of soap ONLY for your use), or antibacterial body wash.  Use a clean wash cloth or sponge when you bathe.  Dry off with a clean towel  after every bath - be careful around any wounds, skin staples, sutures or surgical glue over/on wounds.  Do not enter swimming pools, hot tubs, lakes, rivers and/or ocean until wounds are healed and your doctor/surgeon says it is OK.    Use Clean Sheets  Sleep on freshly laundered sheets after your surgery.  Keep the surgery site covered with a clean, dry bandage (if instructed to do so).  If the bandage becomes soiled, reapply a new, dry, clean bandage.   Do not allow pets to sleep with you while your wound is healing.    Lifestyle Modification and Controlling Your Blood Sugar  Smoking slows wound healing.  Stop smoking and limit exposure to second-hand smoke.  High blood sugar slows wound healing.  Eat a well-balanced diet to provide proper nutrition while healing  Monitor your blood sugar (if you are a diabetic) and take your medications as you are suppose to so you can control you blood sugar after surgery.      URINARY RETENTION AFTER SURGERY  Some surgery (a planned, long surgery, bladder surgery, or some surgeries of the abdomen)  require the placement of a foley catheter (a drain tube in the bladder.)  This drain is often removed prior to you coming out of the OR, or is occasionally left in place to be removed before discharge, or sometimes the drain tube is left to be removed in the surgeon's office at a later time.  Other surgeries never require a drain in the bladder.  But sometimes after surgery, even if a drain was never placed, going to the bathroom can become a problem (you feel like you have to pee, your bladder feels full, but you just cant go.)  In this case, you might need to return to the hospital to have a drain catheter placed.  Call your surgeon and tell them if  this problem happens to you.      COUGH AND DEEP BREATHE  Breathing deep and coughing are very important exercises to do after surgery.  Deep breathing and coughing open the little air tubes and air sacks in your lungs.      You take deep breaths every day.  You may not even notice - it is just something you do when you sigh or yawn.  It is a natural exercise you do to keep these air passages open.      After surgery, take deep breaths and cough, on purpose.      Coughing and deep breathing help prevent bronchitis and pneumonia after surgery.  If you had chest or belly surgery, use a pillow as a "hug buddy" and hold it tightly to your chest or belly when you cough.     DIRECTIONS:  Take 10 to 15 slow deep breaths every hour while awake.  Breathe in deeply, and hold it for 2 seconds.  Exhale slowly through puckered lips, like blowing up a balloon.  After every 4th or 5th deep breath, hug your pillow to your chest or belly and give a hard, deep cough.      Yes, it will probably hurt if you had abdominal surgery.  But doing this exercise is very important part of healing after surgery.  Take your pain medicine to help you do this exercise without too much pain.      ANKLE PUMPS    Ankle pumps increase the circulation of oxygenated blood to your lower extremities and decrease your risk for circulation problems such as blood clots. They also stretch the muscles, tendons and ligaments in your foot and ankle, and prevent joint contracture in the ankle and foot, especially after surgeries on the legs.    It is important to do ankle pump exercises regularly after surgery because immobility increases your risk for developing a blood clot.  Your doctor may also have you take an Aspirin for the next few days as well.      If your doctor did not ask you to take an Aspirin, consult with him before starting Aspirin therapy on your own.    The exercise is quite simple.     Slowly point your foot forward, feeling the muscles on the  top of your lower leg stretch, and hold this position for 5 seconds.                  Next, pull your foot back toward you as far as possible, stretching the calf muscles, and hold that position for 5 seconds.                 Repeat with the other foot.  Perform 10 repetitions every hour while awake for both ankles if possible (down and then up with the foot once is one repetition).    You should feel gentle stretching of the muscles in your lower leg when doing this exercise.  If you feel pain, or your range of motion is limited, don't push too hard.  Only go the limit your joint and muscles will let you go.  If you have increasing pain, progressively worsening leg warmth or swelling, STOP the exercise and call your doctor.       Other Wound Care information:  []$  No additional recommendations.    P.R.I.C.E. INSTRUCTIONS    PRICE is an acronym that stands for Protect, Rest, Ice, Compression, and Elevation (sometimes you might see the acronym RICE.)   Listed below are five activities one can do for an injured limb or soft tissue injury.  While much anecdotal understanding learned through many years of experience supports these seemingly common sense treatments, building scientific evidence is showing how and why these treatment principles are proving to be so beneficial.  Below is a breakdown of what the PRICE principles entail to speed healing along.     PROTECT may sound like an obvious thing to do, and really, it is common sense.  After an injury or surgery, protecting the site that hurts help to prevent further injury.    REST is essential for an injured limb.  Like protection, the more you are up on an injured limb, especially in the early stages of an injury, the more damage you can do.  Rest means no activities that would involve the use of the injured tissues so that the early stages of healing can begin without  interruption.    ICE "is perhaps the simplest and oldest [therapy] in the treatment of soft  tissue injuries."4    Ice help decrease swelling in inflamed and damaged tissues, can diminish the feeling of pain and decrease muscle spasms, and, immediately after an injury,  can slow cellular metabolism and help to prevent further tissue injury from oxygen starvation caused by the swelling.5      COMPRESSION help decrease pain by limiting movement of an injured limb.  Compression can be found through the use of an elastic wrap bandage, a cast, splint, or simply a snug cooling cuff or an ice pack and pillow.    ELEVATION is a very important intervention.  Placing the injury above the level of the heart whenever possible helps decrease swelling by using gravity to one's advantage .  Placing the injury above the level of the heart also helps prevent, or at least decrease, the throbbing pain that is sometimes experienced after surgery or injury.    Sources:  Muscle injuries: optimizing recovery (2007) Best Practice & Research Clinical Rheumatology Vol. 21, No. 2, pp 317-331, Accessed 01/14/10    PRICE first aid guidelines - Protection, Rest, Ice, Compression and Elevation By Laurian Brim, About.com Guide, Updated July 15, 2009, Accessed 01/14/10 [email or web address]    Rest Ice Compression Elevation: RICE for injuries, Accessed 01/14/10 [email or web address]    The use of ice in the treatment of acute soft-tissue injury (2004) [place], Vol. 32, No. 1, pp 251-261, Accessed 01/14/10 [email or web address]    Soft tissue damage and healing; theory and techniques, [email or web address], Ch. 9 of  medical page, by Russella Dar, p. And Sheliah Plane 01/15/10 [email or web address]  Going Home After A  Peripheral Nerve Block    Patient Information    The anesthesiology team has provided for your pain control through a  technique called regional anesthesia.  As the name implies, anesthesia (decreased or no pain, sensation, or motor control) has been provided to a specific region, whether that be an arm or a leg.  "How does this happen?" you might ask.    While you were sleepy, the anesthesia provider provided medicine to a specific group of nerves either in the neck/shoulder region or in the groin and/or buttock region, similar to the way a dentist might numb a tooth (teeth.)  They typically use an ultrasound machine to know where the medicine is placed in relation to the nerves they wish to "numb up" or "block."  What this means is that for the next few hours, you should expect to have a numb limb.    Below are some things we wish for you to read and be familiar with concerning your anesthetized limb.    Caring For a Blocked Body Part    General Considerations:  The numbness may last up to 24 hours  You must protect your arm or leg.  The blocked extremity is numb, weak, and difficult for your brain to locate and communicate with.  To do this you should:  Pay attention to the position of the blocked limb at all times.  Be very careful when placing hot or cod items on a numb limb.  You could cause tissue damage like burns or frostbite if you are unable to feel temperature.  Carefully follow your discharge instructions regarding the use of these therapies in you post-operative care.  Carefully pad the affected limb.  Normally we continually move and adjust the position of our bodies without thinking about it.  This movement and continuous repositioning helps to prevent injuries from immobility.  When a limb is numb it still requires this care  Be extremely careful not to bump or hit the numb body part.  This can result in an unrecognized injury, at lease until the blocked limb wakes up.  It can also result in worse pain of your already post-surgical limb.    Arm/Shoulder Blocks:  You may experience a droopy eyelid, nasal stuffiness,  and redness of the eye after receiving an arm/shoulder block.  This is called Horner's Syndrome, and is very common.  There is no need for concern.  You may also experience mild hoarseness, but all of these symptoms should resolve within 24 hours.  Some patients may experience mild shortness of breath.  A sitting position will help alleviate this and it should resolve within 24 hours.  If you experience significant or progressive worsening of the shortness of breath, seek medical attention immediately.    Knee/Foot Blocks:  DO NOT use the blocked leg to walk, balance or support yourself. Your leg will not be able to balance your weight properly while any part of your leg is numb.  You are at a RISK for FALLS.  Be careful not to drag your foot along the ground or stub your toes.    Contact Phone Numbers    CALL 911 IN CASE OF AN EMERGENCY.    For all other non-emergency inquiries call the [facility] operator at [phone] and ask for the anesthesiologist on call to be paged.  Below is information on the medication(s) your doctor is prescribing for you:           The maximum daily dose of acetaminophen was discussed with the patient. He was encouraged not to exceed 3,000 mg of acetaminophen during a 24 hour period and was asked to keep in mind that acetaminophen can also be found in many over-the-counter cold medications as well as narcotics that may be given for pain. The patient expresses understanding of these issues and questions were answered.      4 THINGS ABOUT PAIN MEDICINE I ALWAYS TALK ABOUT:  There are 4 side effects I always talk about for pain medications.    They make you sleepy and drowsy.  Do not drive a car or operate machines while taking pain medication.  Do not make any major decisions.  Take a nap.  Relax.  Let your body recover from the affects of anesthesia and surgery.    Some people have quite a problem with itching and...  Nausea and/or vomiting.       These are mention together because they are a related genetic issue; while some people experience these problems, others do not.  These are expected and know side effects.      Itching is caused by histamine release - all opiate medications can cause this.  An over-the-counter anti-histamine can help.  Over-the-counter Benadryl (the generic drug name is diphenhydramine) can help, but may cause increased drowsiness which can be intensified by pain medications.  Over-the-counter Claritin (the generic drug name is loratadine) or Zyrtec (the generic drug name is cetirizine) may be effective without as much drowsiness as with the Benadryl/diphenhydramine.      If you have nausea, like the itching, all opiates can cause this.  Hopefully your surgeon has given you some medicine for nausea.  If your surgeon did not give you anti-nausea medications, and you are experiencing nausea/vomiting that prevent you from drinking clear liquids, CALL HIM/HER and request them, especially if this issue seems to get worse after you leave the hospital.    Last but not least is the problem of constipation (not bing able to have a bowel movement - poop.)  All pain medicine can slow down the movement of food through the gut.  The slower it goes, the worse it can be.  This only adds insult to the injury of surgery.  And if you had tummy surgery, like having your gall bladder removed or a hernia repair, YOU DO NOT WANT THIS PROBLEM.  There are 4 things I recommend.    Drink lots of fluids.  For healthy people with no heart problems, this means at least 64 ounces of liquids or more per day.  For example, a Big Gulp from [employer] is 32 ounces.  So you need to drink at least 2  Big Gulp's of fluids every day.  If you have heart problems you may not be able to do this.  Talk to your doctor about what you should do to prevent constipation.    Drinking fruit juice like apple, pear, or prune juice gives you extra "BANG" for your beverage.  These  drinks are high in natural fiber.  If you are a diabetic, drink sugar-free fluids with fiber additives (see next 2 points.)  Avoid drinking extra fruit juice unless this is a regular part of your diet plan.    Eat extra fresh fruits and vegetables.      Add extra fiber-products.  Fiber  products like Metamucil, Citrucel, Miralax or Benefiber can help.  These products are over-the-counter and you do not need a prescription from your doctor.      If you have followed these recommendations and still have some difficulty having a good poop, take and over-the-counter stimulant like Dulcolax (biscodyl)  or Senokot (senna concentrate).  These may help get things moving.        [facility] MEDICATION AND SIDE EFFECT GUIDE    The [facility] MEDICATION AND SIDE EFFECT GUIDE was provided to the PATIENT AND CARE PROVIDER.  Information provided includes instruction about drug purpose and common side effects for the following medications:   Norco  Zofran  Keflex      Medication information added to discharge record on October 09, 2021 at 8:53 AM.      Some information we wish all of our patients to be familiar with and General Information for Healthy Lifestyle choices:    Make a list of your current medications with your Primary Care Provider.  Update this list whenever your medications are discontinued, doses are changed, or new medications (including over-the-counter products like ibuprofen, vitamins, or herbal remedies) are added.  Carry medication information at all times in case of emergency situations      No smoking / No tobacco products / Avoid exposure to second hand smoke    Surgeon General's Warning:  Quitting smoking now greatly reduces serious risk to your health.    Obesity, smoking, and sedentary lifestyle greatly increases your risk for illness.  A healthy diet, regular physical exercise & weight monitoring are important for maintaining a healthy lifestyle.    A Note About Congestive Heart Failure:  You may be  retaining fluid if you have a history of heart failure or if you experience any of the following symptoms:      Weight gain of 3 pounds or more overnight or 5 pounds in a week  Increased swelling in our hands or feet  Shortness of breath while lying flat in bed      Please call your doctor as soon as you notice any of these symptoms; do not wait until your next office visit.    A Note About Strokes:  Recognize signs and symptoms of STROKE.  The simple mnemonic, F.A.S.T., can help you remember signs of a stroke and what to do if you suspect a stroke is occuring to you or someone you are with:    F - Face looks uneven  A - Arms unable to move, or move evenly  S - Speech is slurred or non-existent  T - Time - CALL 911 as soon as signs and symptoms begin - DO NOT go to bed or wait to see if you get better - TIME IS BRAIN.    Warning Signs of HEART ATTACK   Call 911 if you have these symptoms:    Chest discomfort. Most heart attacks involve discomfort in the center of the chest that lasts more than a few minutes, or that goes away and comes back. It can feel like uncomfortable pressure, squeezing, fullness, or pain.     Discomfort in other areas of the upper body. Symptoms can include pain or discomfort in one or both arms, the back, neck, jaw, or stomach.    Shortness of breath with or without chest discomfort.    Other signs may include breaking out in a cold sweat, nausea, or lightheadedness.  Don't wait more than five minutes to call  911 - MINUTES MATTER! Fast action can save your life. Calling 911 is almost always the fastest way to get lifesaving treatment. Emergency Medical Services staff can begin treatment when they arrive -- up to an hour sooner than if someone gets to the hospital by car.     Learning About Coronavirus (COVID-19)  Coronavirus (COVID-19): Overview  What is coronavirus (COVID-19)?  The coronavirus disease (COVID-19) is caused by a virus. It is an illness that was first found in [place], [place], in  December 2019. It has since spread worldwide.  The virus can cause fever, cough, and trouble breathing. In severe cases, it can cause pneumonia and make it hard to breathe without help. It can cause death.  Coronaviruses are a large group of viruses. They cause the common cold. They also cause more serious illnesses like Middle East respiratory syndrome (MERS) and severe acute respiratory syndrome (SARS). COVID-19 is caused by a novel coronavirus. That means it's a new type that has not been seen in people before.  This virus spreads person-to-person through droplets from coughing and sneezing. It can also spread when you are close to someone who is infected. And it can spread when you touch something that has the virus on it, such as a doorknob or a tabletop.  What can you do to protect yourself from coronavirus (COVID-19)?  The best way to protect yourself from getting sick is to:  Avoid areas where there is an outbreak.  Avoid contact with people who may be infected.  Wash your hands often with soap or alcohol-based hand sanitizers.  Avoid crowds and try to stay at least 6 feet away from other people.  Wash your hands often, especially after you cough or sneeze. Use soap and water, and scrub for at least 20 seconds. If soap and water aren't available, use an alcohol-based hand sanitizer.  Avoid touching your mouth, nose, and eyes.  What can you do to avoid spreading the virus to others?  To help avoid spreading the virus to others:  Cover your mouth with a tissue when you cough or sneeze. Then throw the tissue in the trash.  Use a disinfectant to clean things that you touch often.  Stay home if you are sick or have been exposed to the virus. Don't go to school, work, or public areas. And don't use public transportation.  If you are sick:  Leave your home only if you need to get medical care. But call the doctor's office first so they know you're coming. And wear a face mask, if you have one.  If you have a face  mask, wear it whenever you're around other people. It can help stop the spread of the virus when you cough or sneeze.  Clean and disinfect your home every day. Use household cleaners and disinfectant wipes or sprays. Take special care to clean things that you grab with your hands. These include doorknobs, remote controls, phones, and handles on your refrigerator and microwave. And don't forget countertops, tabletops, bathrooms, and computer keyboards.  When to call for help  Call 911 anytime you think you may need emergency care. For example, call if:  You have severe trouble breathing. (You can't talk at all.)  You have constant chest pain or pressure.  You are severely dizzy or lightheaded.  You are confused or can't think clearly.  Your face and lips have a blue color.  You pass out (lose consciousness) or are very hard to wake up.  Call  your doctor now if you develop symptoms such as:  Shortness of breath.  Fever.  Cough.  If you need to get care, call ahead to the doctor's office for instructions before you go. Make sure you wear a face mask, if you have one, to prevent exposing other people to the virus.  Where can you get the latest information?  The following health organizations are tracking and studying this virus. Their websites contain the most up-to-date information. You'll also learn what to do if you think you may have been exposed to the virus.  U.S. Centers for Disease Control and Prevention (CDC): The CDC provides updated news about the disease and travel advice. The website also tells you how to prevent the spread of infection. FootballExhibition.com.br  World Health Organization [facility]): WHO offers information about the virus outbreaks. WHO also has travel advice. https://castaneda-walker.com/  Current as of: July 21, 2018               Content Version: 12.4   2006-2020 [employer], Incorporated.   Care instructions adapted under license by your healthcare professional. If you have questions about a medical condition or this  instruction, always ask your healthcare professional. Healthwise, Incorporated disclaims any warranty or liability for your use of this information.    AT THE COMPLETION OF DISCHARGE INSTRUCTION REVIEW, WE VERIFY:  The discharge information has been reviewed with the patient and caregiver.    Questions have been asked and answered meeting patient and caregiver expectations.  The patient and caregiver verbalized understanding.    Your discharge nurse was Garvin Fila BSN, RN-BC       Board Certified - Pain Management      CONTENTS FOUND IN YOUR DISCHARGE ENVELOPE:  [x]      Surgeon and Hospital Discharge Instructions  []      [facility] Care Provider Card  [x]      Medication & Side Effect Guide            (your newly prescribed medications have been marked/highlighted showing the most common side effects from the classes of drugs on your prescriptions)  [x]      Medication Prescription(s) x 3 ( [x]  These have been sent electronically to your pharmacy by your surgeon,   - OR -       your surgeon has already provided these to you during a previous/pre-op office visit)  [x]      Pain block and/or block with On-Q Catheter from Anesthesia Service (information included in your instructions above)         []     [facility] Education Information  []      Physical Therapy Prescription  []      Follow-up Appointment Cards  []      Surgery-related Pictures/Media  []      Medical device information sheets/pamphlets from their manufacturer   []      School/work excuse note.  []      Driver/parent work excuse note.      The following personal items collected during your admission for safe keeping are returned to you:     Dental Appliance:    Vision:    Hearing Aid:    Jewelry:    Clothing:    Other Valuables:    Valuables sent to safe:

## 2021-10-09 NOTE — Other (Signed)
D/C reviewed at length with wife, sling placed, ice packs given, cheese block given, questions/concerns denied.

## 2021-12-18 NOTE — Progress Notes (Signed)
Formatting of this note is different from the original.  Post-op Visit    Subjective:   Patient ID: Jason Shah is a 56 y.o. left    hand dominant male.  Pain rating: 3  out of 10    Chief Complaint: Pain of the Left Wrist     Jason Shah presents today 10 weeks status post LEFT ulnar nerve release with transposition and LEFT carpal tunnel release performed on 10/09/21 . He says that he is doing well. He does have some slight discomfort in his left pinky finger. There have been no changes in medical history and review of systems is otherwise noncontributory.    Objective:   Vital signs  Ht Readings from Last 1 Encounters:   12/18/21 5\' 11"      Wt Readings from Last 1 Encounters:   12/18/21 220 lb    Body mass index is 30.68 kg/m.   BP Readings from Last 1 Encounters:   12/18/21 (!) 147/80     Constitutional: General appearance:  Well nourished, well hydrated, in no acute distress.  Eyes:  Sclera are nonicteric.  Respiratory:  Respiratory effort:  No intercostal retractions or use of accessary muscles.  Skin:  Skin inspection:  No rashes, lesions in the area of examination.  Psychiatric: Alert and oriented x3.  Musculoskeletal     His left cubital and left carpal tunnel incisions are well healed.  He has full range of motion of his fingers and wrist and elbow.  He has a slight residual diminished sensation over the ulnar subcu border of his forearm and hand but otherwise he is doing extremely well.  Procedures:    Procedures    Radiographs:      No imaging obtained      Assessment:     1. Cubital tunnel syndrome, unspecified laterality    2. Bilateral carpal tunnel syndrome         Plan:   Overall he is done extremely well with his left ulnar nerve and left carpal tunnel release surgery.  He is almost fully recovered a 10 weeks and set up his other side for surgery in September 7th.    @OVPROCDOC @     Orders Placed This Encounter    BP Patient Education     No follow-ups on file.       Electronically signed  by 02-15-1980, MD at 12/18/2021  3:17 PM EDT

## 2022-01-02 NOTE — Other (Signed)
[facility]                   [address] [place], [place] [address]   MAIN OR                                  [phone]   MAIN PRE OP                          [phone]                                                                                AMBULATORY PRE OP          [phone]  PRE-ADMISSION TESTING    [phone]   Surgery Date:  [facility] 01/08/22       Is surgery arrival time given by surgeon?    NO  If "NO", [facility] staff will call you between 3 and 7pm the day before your surgery with your arrival time. (If your surgery is on a Monday, we will call you the Friday before.)    Call [phone] after 7pm Monday-Friday if you did not receive this call.    INSTRUCTIONS BEFORE YOUR SURGERY   When You  Arrive Arrive at the 2nd Floor Admitting Desk on the day of your surgery  Have your insurance card, photo ID, and any copayment (if needed)   Food   and   Drink NO food or drink after midnight the night before surgery    This means NO water, gum, mints, coffee, juice, etc.  No alcohol (beer, wine, liquor) 24 hours before and after surgery   Medications to   TAKE   Morning of Surgery MEDICATIONS TO TAKE THE MORNING OF SURGERY WITH A SIP OF WATER:   ALBUTEROL IF NEEDED  LIPITOR  ZYRTEC  ZETIA  FLONASE IF NEEDED  GABAPENTIN     Medications  To  STOP      7 days before surgery Non-Steroidal anti-inflammatory Drugs (NSAID's): for example, Ibuprofen (Advil, Motrin), Naproxen (Aleve)  Aspirin, if taking for pain   Herbal supplements, vitamins, and fish oil  Other:FOLLOW CARDIOLOGIST DR Maisie Fus' INSTRUCTIONS REGARDING WHEN TO STOP ASPIRIN PRIOR TO SURGERY  (Pain medications not listed above, including Tylenol may be taken)   Blood  Thinners If you take  Aspirin, Plavix, Coumadin, or any blood-thinning or anti-blood clot medicine, talk to the doctor who prescribed the medications for pre-operative instructions.   Bathing Clothing  Jewelry  Valuables     If you shower the  morning of surgery, please do not apply anything to your skin (lotions, powders, deodorant, or makeup, especially mascara)  Follow Chlorhexidine [employer] body wash instructions provided to you during PAT appointment. Begin 3 days prior to surgery.  Do not shave or trim anywhere 24 hours before surgery  Wear your hair loose or down; no pony-tails, buns, or metal hair clips  Wear loose, comfortable, clean clothes  Wear glasses instead of contacts  Leave money, valuables, and jewelry, including body piercings,  at home  If you were given an [employer], bring it on day of surgery.     Going Home - or Spending the Night SAME-DAY SURGERY: You must have a responsible adult drive you home and stay with you 24 hours after surgery  ADMITS: If your doctor is keeping you in the hospital after surgery, leave personal belongings/luggage in your car until you have a hospital room number.    Hospital discharge time is 12 noon  Drivers must be here before 12 noon unless you are told differently   Special Instructions PLEASE BRING CPAP WITH YOU ON DAY OF SURGERY  BRING RESCUE INHALER WITH YOU ON DAY OF SURGERY       Follow all instructions so your surgery won't be cancelled.  Please, be on time.                    If a situation occurs and you are delayed the day of surgery, call [phone] or [phone].    If your physical condition changes (like a fever, cold, flu, etc.) call your surgeon.    Home medication(s) reviewed and verified via      LIST   VERBAL   during PAT appointment.    The patient was contacted by     IN-PERSON  The patient verbalizes understanding of all instructions and     DOES NOT   need reinforcement.

## 2022-01-08 ENCOUNTER — Inpatient Hospital Stay: Payer: PRIVATE HEALTH INSURANCE | Attending: Orthopaedic Surgery

## 2022-01-08 MED ORDER — PROPOFOL 200 MG/20ML IV EMUL
200 MG/20ML | INTRAVENOUS | Status: DC | PRN
Start: 2022-01-08 — End: 2022-01-08
  Administered 2022-01-08: 12:00:00 50 via INTRAVENOUS

## 2022-01-08 MED ORDER — FENTANYL CITRATE (PF) 100 MCG/2ML IJ SOLN
100 MCG/2ML | INTRAMUSCULAR | Status: AC
Start: 2022-01-08 — End: ?

## 2022-01-08 MED ORDER — MIDAZOLAM HCL 5 MG/5ML IJ SOLN
5 MG/ML | INTRAMUSCULAR | Status: AC
Start: 2022-01-08 — End: ?

## 2022-01-08 MED ORDER — STERILE WATER FOR INJECTION (MIXTURES ONLY)
1 g | Freq: Once | INTRAMUSCULAR | Status: AC
Start: 2022-01-08 — End: 2022-01-08
  Administered 2022-01-08: 12:00:00 2000 mg via INTRAVENOUS

## 2022-01-08 MED ORDER — FENTANYL CITRATE (PF) 100 MCG/2ML IJ SOLN
100 MCG/2ML | INTRAMUSCULAR | Status: DC | PRN
Start: 2022-01-08 — End: 2022-01-08
  Administered 2022-01-08: 12:00:00 50 via INTRAVENOUS
  Administered 2022-01-08: 11:00:00 100 via INTRAVENOUS
  Administered 2022-01-08: 12:00:00 50 via INTRAVENOUS

## 2022-01-08 MED ORDER — FENTANYL CITRATE (PF) 100 MCG/2ML IJ SOLN
100 MCG/2ML | Freq: Once | INTRAMUSCULAR | Status: DC | PRN
Start: 2022-01-08 — End: 2022-01-08

## 2022-01-08 MED ORDER — ROPIVACAINE HCL 5 MG/ML IJ SOLN
INTRAMUSCULAR | Status: DC | PRN
Start: 2022-01-08 — End: 2022-01-08
  Administered 2022-01-08: 11:00:00 30 via EPIDURAL

## 2022-01-08 MED ORDER — MIDAZOLAM HCL 5 MG/5ML IJ SOLN
5 MG/ML | INTRAMUSCULAR | Status: DC | PRN
Start: 2022-01-08 — End: 2022-01-08
  Administered 2022-01-08: 12:00:00 1 via INTRAVENOUS
  Administered 2022-01-08 (×2): 2 via INTRAVENOUS

## 2022-01-08 MED ORDER — LIDOCAINE HCL (PF) 1 % IJ SOLN
1 % | Freq: Once | INTRAMUSCULAR | Status: DC | PRN
Start: 2022-01-08 — End: 2022-01-08

## 2022-01-08 MED ORDER — PHENYLEPHRINE HCL (PRESSORS) 0.4 MG/10ML IV SOSY
0.4 MG/10ML | INTRAVENOUS | Status: DC | PRN
Start: 2022-01-08 — End: 2022-01-08
  Administered 2022-01-08: 12:00:00 80 via INTRAVENOUS

## 2022-01-08 MED ORDER — HYDROMORPHONE 0.5MG/0.5ML IJ SOLN
1 MG/ML | Status: DC | PRN
Start: 2022-01-08 — End: 2022-01-08

## 2022-01-08 MED ORDER — ONDANSETRON HCL 4 MG/2ML IJ SOLN
4 MG/2ML | Freq: Once | INTRAMUSCULAR | Status: DC | PRN
Start: 2022-01-08 — End: 2022-01-08

## 2022-01-08 MED ORDER — LACTATED RINGERS IV SOLN
INTRAVENOUS | Status: DC
Start: 2022-01-08 — End: 2022-01-08

## 2022-01-08 MED ORDER — MIDAZOLAM HCL (PF) 2 MG/2ML IJ SOLN
2 MG/ML | Freq: Once | INTRAMUSCULAR | Status: DC | PRN
Start: 2022-01-08 — End: 2022-01-08

## 2022-01-08 MED ORDER — DIPHENHYDRAMINE HCL 50 MG/ML IJ SOLN
50 MG/ML | Freq: Once | INTRAMUSCULAR | Status: DC | PRN
Start: 2022-01-08 — End: 2022-01-08

## 2022-01-08 MED ORDER — DEXMEDETOMIDINE HCL 200 MCG/2ML IV SOLN
200 MCG/2ML | INTRAVENOUS | Status: DC | PRN
Start: 2022-01-08 — End: 2022-01-08
  Administered 2022-01-08: 12:00:00 4 via INTRAVENOUS

## 2022-01-08 MED ORDER — DEXMEDETOMIDINE HCL 200 MCG/2ML IV SOLN
200 MCG/2ML | INTRAVENOUS | Status: AC
Start: 2022-01-08 — End: ?

## 2022-01-08 MED ORDER — BACITRACIN ZINC 500 UNIT/GM EX OINT
500 UNIT/GM | CUTANEOUS | Status: AC
Start: 2022-01-08 — End: ?

## 2022-01-08 MED ORDER — MIDAZOLAM HCL 2 MG/2ML IJ SOLN
2 MG/ML | INTRAMUSCULAR | Status: AC
Start: 2022-01-08 — End: ?

## 2022-01-08 MED FILL — BACITRACIN ZINC 500 UNIT/GM EX OINT: 500 UNIT/GM | CUTANEOUS | Qty: 28

## 2022-01-08 MED FILL — DEXMEDETOMIDINE HCL 200 MCG/2ML IV SOLN: 200 MCG/2ML | INTRAVENOUS | Qty: 2

## 2022-01-08 MED FILL — FENTANYL CITRATE (PF) 100 MCG/2ML IJ SOLN: 100 MCG/2ML | INTRAMUSCULAR | Qty: 2

## 2022-01-08 MED FILL — MIDAZOLAM HCL 5 MG/5ML IJ SOLN: 5 MG/ML | INTRAMUSCULAR | Qty: 5

## 2022-01-08 MED FILL — LACTATED RINGERS IV SOLN: INTRAVENOUS | Qty: 1000

## 2022-01-08 MED FILL — CEFAZOLIN SODIUM 1 G IJ SOLR: 1 g | INTRAMUSCULAR | Qty: 2000

## 2022-01-08 MED FILL — MIDAZOLAM HCL 2 MG/2ML IJ SOLN: 2 MG/ML | INTRAMUSCULAR | Qty: 2

## 2022-01-08 NOTE — Anesthesia Pre-Procedure Evaluation (Signed)
Department of Anesthesiology  Preprocedure Note       Name:  Jason Shah   Age:  56 y.o.  DOB:  [birth year]                                          MRN:  [id number]         Date:  01/08/2022      Surgeon: Juliann Mule):  Jason Gum, MD    Procedure: Procedure(s):  RIGHT ULNAR NERVE RELEASE WITH TRANSPOSITION + RIGHT CARPAL TUNNEL RELEASE (ANES REGIONAL BLOCK WITH MAC)  .    Medications prior to admission:   Prior to Admission medications    Medication Sig Start Date End Date Taking? Authorizing Provider   Fluticasone Propionate (FLONASE NA) by Nasal route as needed    Historical Provider, MD   Acetaminophen (TYLENOL EXTRA STRENGTH PO) Take by mouth as needed    Historical Provider, MD   aspirin 81 MG chewable tablet Take 2 tablets by mouth every morning    Ar Automatic Reconciliation   atorvastatin (LIPITOR) 80 MG tablet Take 1 tablet by mouth every morning 03/31/16   Ar Automatic Reconciliation   Cetirizine HCl (ZYRTEC ALLERGY) 10 MG CAPS Take 10 mg by mouth every morning    Ar Automatic Reconciliation   ezetimibe (ZETIA) 10 MG tablet 1 tablet every morning 02/27/21   Ar Automatic Reconciliation   gabapentin (NEURONTIN) 300 MG capsule Take 1 capsule by mouth 2 times daily.    Ar Automatic Reconciliation   Naproxen Sodium 220 MG CAPS Take by mouth as needed    Ar Automatic Reconciliation   tiZANidine (ZANAFLEX) 2 MG capsule Take 1 capsule by mouth nightly    Ar Automatic Reconciliation   albuterol sulfate HFA (PROVENTIL;VENTOLIN;PROAIR) 108 (90 Base) MCG/ACT inhaler INHALE 2 PUFFS BY MOUTH EVERY 4 HOURS AS NEEDED FOR WHEEZING 03/21/20   Ar Automatic Reconciliation       Current medications:    No current facility-administered medications for this visit.     No current outpatient medications on file.     Facility-Administered Medications Ordered in Other Visits   Medication Dose Route Frequency Provider Last Rate Last Admin   . lidocaine PF 1 % injection 1 mL  1 mL IntraDERmal Once PRN Darron Doom, MD       .  fentaNYL (SUBLIMAZE) injection 100 mcg  100 mcg IntraVENous Once PRN Darron Doom, MD       . lactated ringers IV soln infusion   IntraVENous Continuous Darron Doom, MD       . midazolam PF (VERSED) injection 2 mg  2 mg IntraVENous Once PRN Darron Doom, MD       . ceFAZolin (ANCEF) 2,000 mg in sterile water 20 mL IV syringe  2,000 mg IntraVENous Once Jason Gum, MD           Allergies:    Allergies   Allergen Reactions   . Rosuvastatin Myalgia       Problem List:    Patient Active Problem List   Diagnosis Code   . Eczema L30.9   . Bronchitis J40   . Thoracic aortic aneurysm (HCC) I71.20   . 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 Bicuspid aortic valve Q23.1   . Hypercholesterolemia E78.00   . Allergic rhinitis J30.9   . Depression F32.A  Past Medical History:        Diagnosis Date   . Acute anterior wall MI ([place]) 06/24/2011    06/21/11. In [place]. Cath:   LAD obstruction   ; tx: 2 stents. Also has obstruction  in  2nd marginal of the LCX  Proximal aortic root dilatation.  Several days after cath: Echo: EF 49 %. .     . Allergic rhinitis    . Aneurysm of aorta ([place]) 08/2013    [facility] Procedure   . Arthritis     Cervical spondylosis   . Asthma     exercise induced as child; RAD while ill   . Bicuspid aortic valve 08/01/2013    NON STENOTIC   . Bronchitis 12/30/2010   . CAD (coronary artery disease)    . Cervical disc disease     C5-C6 disc disease.   . Depression 12/30/2010   . Eczema 12/30/2010   . Hypercholesterolemia 06/24/2011   . MI (myocardial infarction) ([place]) 2014   . OSA on CPAP    . Pneumonia     several episodes in childhood.        Past Surgical History:        Procedure Laterality Date   . ARM SURGERY Left 10/09/2021    LEFT ULNAR NERVE RELEASE WITH TRANSPOSITION AND LEFT CARPAL TUNNEL RELEASE performed by Jason Gum, MD at [facility]  2013    stents x 2   . COLONOSCOPY      age 44 . results negative . f/u 10 years    . JOINT REPLACEMENT Left 02/2019     partial knee replacement   . KNEE ARTHROSCOPY W/ MENISCECTOMY Left 11/2015   . KNEE ARTHROSCOPY W/ MENISCECTOMY Right 07/2019   . LIPOMA RESECTION Bilateral     removal of lipomas bilateral arms   . ROTATOR CUFF REPAIR Right 11/2019    w/ right long head biceps repair   . SHOULDER SURGERY Right 1994    Right bankhardt   . THORACIC AORTIC ANEURYSM REPAIR  2015    Had Jason Shah Procedure at [facility]   . VASECTOMY     . VASECTOMY REVERSAL     . WISDOM TOOTH EXTRACTION         Social History:    Social History     Tobacco Use   . Smoking status: Never   . Smokeless tobacco: Former     Types: Chew     Quit date: 2020   . Tobacco comments:     Dips    Substance Use Topics   . Alcohol use: Yes     Alcohol/week: 2.0 standard drinks of alcohol     Comment: 6-10 glasses of wine weekly                                Counseling given: Not Answered  Tobacco comments: Dips       Vital Signs (Current):   There were no vitals filed for this visit.                                           BP Readings from Last 3 Encounters:   01/08/22 (!) 134/94   10/09/21 125/81   10/02/21 108/79  NPO Status:                                                                                 BMI:   Wt Readings from Last 3 Encounters:   01/08/22 104.1 kg (229 lb 8 oz)   10/09/21 104.8 kg (231 lb 0.7 oz)   10/02/21 104.6 kg (230 lb 9.6 oz)     There is no height or weight on file to calculate BMI.    CBC:   Lab Results   Component Value Date/Time    WBC 6.0 02/18/2021 10:31 AM    RBC 5.38 02/18/2021 10:31 AM    HGB 16.7 02/18/2021 10:31 AM    HCT 48.4 02/18/2021 10:31 AM    MCV 90 02/18/2021 10:31 AM    MCV 91.1 05/26/2018 03:52 PM    RDW 12.2 02/18/2021 10:31 AM    PLT 183 02/18/2021 10:31 AM       CMP:   Lab Results   Component Value Date/Time    NA 139 10/02/2021 10:09 AM    K 4.5 10/02/2021 10:09 AM    CL 108 10/02/2021 10:09 AM    CO2 29 10/02/2021 10:09 AM    BUN 24 10/02/2021 10:09 AM    CREATININE 1.17 10/02/2021 10:09 AM    GFRAA 87  02/15/2020 02:15 PM    AGRATIO 2.2 07/24/2021 09:28 AM    LABGLOM >60 10/02/2021 10:09 AM    LABGLOM 58 07/24/2021 09:28 AM    GLUCOSE 107 10/02/2021 10:09 AM    PROT 6.9 10/02/2021 10:09 AM    CALCIUM 8.8 10/02/2021 10:09 AM    BILITOT 1.0 10/02/2021 10:09 AM    ALKPHOS 73 10/02/2021 10:09 AM    ALKPHOS 81 07/24/2021 09:28 AM    AST 37 10/02/2021 10:09 AM    ALT 48 10/02/2021 10:09 AM       POC Tests: No results for input(s): "POCGLU", "POCNA", "POCK", "POCCL", "POCBUN", "POCHEMO", "POCHCT" in the last 72 hours.    Coags: No results found for: "PROTIME", "INR", "APTT"    HCG (If Applicable): No results found for: "PREGTESTUR", "PREGSERUM", "HCG", "HCGQUANT"     ABGs: No results found for: "PHART", "PO2ART", "PCO2ART", "HCO3ART", "BEART", "O2SATART"     Type & Screen (If Applicable):  No results found for: "LABABO", "LABRH"    Drug/Infectious Status (If Applicable):  No results found for: "HIV", "HEPCAB"    COVID-19 Screening (If Applicable): No results found for: "COVID19"        Anesthesia Evaluation  Patient summary reviewed and Nursing notes reviewed  Airway: Mallampati: III  TM distance: >3 FB   Neck ROM: full  Mouth opening: > = 3 FB   Dental: normal exam         Pulmonary:normal exam  breath sounds clear to auscultation  (+) sleep apnea: on CPAP,  asthma: exercise-induced asthma,                            Cardiovascular:  Exercise tolerance: good (>4 METS),   (+) past MI: > 6 months and no interval change, CAD: obstructive, CABG/stent:, hyperlipidemia  ECG reviewed  Rhythm: regular  Rate: normal  Echocardiogram reviewed                  Neuro/Psych:   Negative Neuro/Psych ROS              GI/Hepatic/Renal:   (+) GERD:,           Endo/Other: Negative Endo/Other ROS   (+) : arthritis:., .                 Abdominal:             Vascular:     PVD: s/p aneurysm repair.      Other Findings:             Anesthesia Plan      regional, TIVA and MAC     ASA 3       Induction: intravenous.    MIPS: Postoperative  opioids intended and Prophylactic antiemetics administered.  Anesthetic plan and risks discussed with patient.        Attending anesthesiologist reviewed and agrees with Preprocedure content                Osborne Casco, MD   01/08/2022

## 2022-01-08 NOTE — Anesthesia Procedure Notes (Signed)
Peripheral Block    Patient location during procedure: pre-op  Reason for block: procedure for pain, post-op pain management, primary anesthetic and at surgeon's request  Start time: 01/08/2022 7:01 AM  End time: 01/08/2022 7:11 AM  Staffing  Performed: resident/CRNA   Resident/CRNA: Clydell Hakim, APRN - CRNA  Performed by: Clydell Hakim, APRN - CRNA  Authorized by: Danella Sensing, MD    Preanesthetic Checklist  Completed: patient identified, IV checked, site marked, risks and benefits discussed, surgical/procedural consents, pre-op evaluation, timeout performed, anesthesia consent given, oxygen available and monitors applied/VS acknowledged  Peripheral Block   Patient position: supine  Prep: ChloraPrep  Provider prep: mask and sterile gloves  Patient monitoring: cardiac monitor, continuous pulse ox, continuous capnometry, frequent blood pressure checks, IV access, oxygen and responsive to questions  Block type: Brachial plexus  Supraclavicular  Laterality: right  Injection technique: single-shot  Guidance: nerve stimulator and ultrasound guided    Needle   Needle type: Other   Needle gauge: 22 G  Needle localization: nerve stimulator and ultrasound guidance  Needle length: 5 cmOther needle type: STIMUPLEX  Assessment   Injection assessment: negative aspiration for heme, no paresthesia on injection, local visualized surrounding nerve on ultrasound and no intravascular symptoms  Hemodynamics: stable  Outcomes: patient tolerated procedure well    Additional Notes  Olivia RN witnessed timeout and block written on the correct side.

## 2022-01-08 NOTE — Discharge Instructions (Signed)
DISCHARGE SUMMARY from your Nurse      PATIENT INSTRUCTIONS    After general anesthesia or intravenous sedation, for 24 hours or while taking prescription Narcotics:  Limit your activities  Do not drive and operate hazardous machinery  Do not make important personal or business decisions  Do  not drink alcoholic beverages  If you have not urinated within 8 hours after discharge, please contact your surgeon on call.    Report the following to your surgeon:  Excessive pain, swelling, redness or odor of or around the surgical area  Temperature over 100.5  Nausea and vomiting lasting longer than 4 hours or if unable to take medications  Any signs of decreased circulation or nerve impairment to extremity: change in color, persistent  numbness, tingling, coldness or increase pain  Any questions      GOOD HELP TO FIGHT AN INFECTION  Here are a few tip to help reduce the chance of getting an infection after surgery:  Wash Your Hands  Good handwashing is the most important thing you and your caregiver can do.  Wash before and after caring for any wounds.  Dry your hand with a clean towel.  Wash with soap and water for at least 20 seconds. A TIP: sing the "Happy Birthday" song through one time while washing to help with the timing.  Use a hand sanitizer in between washings.    Shower  When your surgeon says it is OK to take a shower, use a new bar of antibacterial soap (if that is what you use, and keep that bar of soap ONLY for your use), or antibacterial body wash.  Use a clean wash cloth or sponge when you bathe.  Dry off with a clean towel  after every bath - be careful around any wounds, skin staples, sutures or surgical glue over/on wounds.  Do not enter swimming pools, hot tubs, lakes, rivers and/or ocean until wounds are healed and your doctor/surgeon says it is OK.    Use Clean Sheets  Sleep on freshly laundered sheets after your surgery.  Keep the surgery site covered with a clean, dry bandage (if instructed to do  so).  If the bandage becomes soiled, reapply a new, dry, clean bandage.   Do not allow pets to sleep with you while your wound is healing.    Lifestyle Modification and Controlling Your Blood Sugar  Smoking slows wound healing.  Stop smoking and limit exposure to second-hand smoke.  High blood sugar slows wound healing.  Eat a well-balanced diet to provide proper nutrition while healing  Monitor your blood sugar (if you are a diabetic) and take your medications as you are suppose to so you can control you blood sugar after surgery.      COUGH AND DEEP BREATHE    Breathing deeply and coughing are very important exercises to do after surgery.  Deep breathing and coughing open the [place] air tubes and air sacks in your lungs.       You take deep breaths every day.  You may not even notice - it is just something you do when you sigh or yawn.  It is a natural exercise you do to keep these air passages open.      After surgery, take deep breaths and cough, on purpose.    DIRECTIONS:  Take 10 to 15 slow deep breaths every hour while awake.  Breathe in deeply, and hold it for 2 seconds.  Exhale slowly through puckered lips,  like blowing up a balloon.  After every 4th or 5th deep breath, hug your pillow to your chest or belly and give a hard, deep cough.      Yes, it will probably hurt.  But doing this exercise is a very important part of healing after surgery.  Take your pain medicine to help you do this exercise without too much pain.    Coughing and deep breathing help prevent bronchitis and pneumonia after surgery.  If you had chest or belly surgery, use a pillow as a "hug buddy" and hold it tightly to your chest or belly when you cough.       ANKLE PUMPS    Ankle pumps increase the circulation of oxygenated blood to your lower extremities and decrease your risk for circulation problems such as blood clots. They also stretch the muscles, tendons and ligaments in your foot and ankle, and prevent joint contracture in the  ankle and foot, especially after surgeries on the legs.    It is important to do ankle pump exercises regularly after surgery because immobility increases your risk for developing a blood clot.  Your doctor may also have you take an Aspirin for the next few days as well.      If your doctor did not ask you to take an Aspirin, consult with him before starting Aspirin therapy on your own.    The exercise is quite simple.     Slowly point your foot forward, feeling the muscles on the top of your lower leg stretch, and hold this position for 5 seconds.                  Next, pull your foot back toward you as far as possible, stretching the calf muscles, and hold that position for 5 seconds.                 Repeat with the other foot.  Perform 10 repetitions every hour while awake for both ankles if possible (down and then up with the foot once is one repetition).    You should feel gentle stretching of the muscles in your lower leg when doing this exercise.  If you feel pain, or your range of motion is limited, don't push too hard.  Only go the limit your joint and muscles will let you go.  If you have increasing pain, progressively worsening leg warmth or swelling, STOP the exercise and call your doctor.           MEDICATION AND SIDE EFFECT GUIDE    The [employer] MEDICATION AND SIDE EFFECT GUIDE was provided to the PATIENT AND CARE PROVIDER.  Information provided includes instruction about drug purpose and common side effects for the following medications:   Keflex  Zofran  Norco        These are general instructions for a healthy lifestyle:    *   Please give a list of your current medications to your Primary Care Provider.  *   Please update this list whenever your medications are discontinued, doses are changed, or new medications (including over-the-counter products) are added.  *   Please carry medication information at all times in case of emergency situations.      About Smoking  No smoking / No tobacco  products  Avoid exposure to second hand smoke     Surgeon General's Warning:  Quitting smoking now greatly reduces serious risk to your health.    Obesity, smoking, and  sedentary lifestyle greatly increases your risk for illness and disease.  A healthy diet, regular physical exercise & weight monitoring are important for maintaining a healthy lifestyle.      Congestive Heart Failure  You may be retaining fluid if you have a history of heart failure or if you experience any of the following symptoms:  Weight gain of 3 pounds or more overnight or 5 pounds in a week, increased swelling in your hands or feet or shortness of breath while lying flat in bed.  Please call your doctor as soon as you notice any of these symptoms; do not wait until your next office visit.      Recognize signs and symptoms of STROKE:  F -  Face looks uneven  A -  Arms unable to move or move evenly  S -  Speech slurred or non-existent  T -  Time-call 911 as soon as signs and symptoms begin-DO NOT go          back to bed or wait to see if you get better-TIME IS BRAIN.      Warning Signs of HEART ATTACK   Call 911 if you have these symptoms:    Chest discomfort. Most heart attacks involve discomfort in the center of the chest that lasts more than a few minutes, or that goes away and comes back. It can feel like uncomfortable pressure, squeezing, fullness, or pain.  Discomfort in other areas of the upper body. Symptoms can include pain or discomfort in one or both arms, the back, neck, jaw, or stomach.  Shortness of breath with or without chest discomfort.  Other signs may include breaking out in a cold sweat, nausea, or lightheadedness.    Don't wait more than five minutes to call 911 - MINUTES MATTER! Fast action can save your life. Calling 911 is almost always the fastest way to get lifesaving treatment. Emergency Medical Services staff can begin treatment when they arrive -- up to an hour sooner than if someone gets to the hospital by car.        Learning About Coronavirus (COVID-19)  Coronavirus (COVID-19): Overview  What is coronavirus (COVID-19)?  The coronavirus disease (COVID-19) is caused by a virus. It is an illness that was first found in [place], [place], in December 2019. It has since spread worldwide.  The virus can cause fever, cough, and trouble breathing. In severe cases, it can cause pneumonia and make it hard to breathe without help. It can cause death.  Coronaviruses are a large group of viruses. They cause the common cold. They also cause more serious illnesses like Middle East respiratory syndrome (MERS) and severe acute respiratory syndrome (SARS). COVID-19 is caused by a novel coronavirus. That means it's a new type that has not been seen in people before.  This virus spreads person-to-person through droplets from coughing and sneezing. It can also spread when you are close to someone who is infected. And it can spread when you touch something that has the virus on it, such as a doorknob or a tabletop.  What can you do to protect yourself from coronavirus (COVID-19)?  The best way to protect yourself from getting sick is to:  Avoid areas where there is an outbreak.  Avoid contact with people who may be infected.  Wash your hands often with soap or alcohol-based hand sanitizers.  Avoid crowds and try to stay at least 6 feet away from other people.  Wash your hands often, especially after you   cough or sneeze. Use soap and water, and scrub for at least 20 seconds. If soap and water aren't available, use an alcohol-based hand sanitizer.  Avoid touching your mouth, nose, and eyes.  What can you do to avoid spreading the virus to others?  To help avoid spreading the virus to others:  Cover your mouth with a tissue when you cough or sneeze. Then throw the tissue in the trash.  Use a disinfectant to clean things that you touch often.  Stay home if you are sick or have been exposed to the virus. Don't go to school, work, or public areas. And  don't use public transportation.  If you are sick:  Leave your home only if you need to get medical care. But call the doctor's office first so they know you're coming. And wear a face mask, if you have one.  If you have a face mask, wear it whenever you're around other people. It can help stop the spread of the virus when you cough or sneeze.  Clean and disinfect your home every day. Use household cleaners and disinfectant wipes or sprays. Take special care to clean things that you grab with your hands. These include doorknobs, remote controls, phones, and handles on your refrigerator and microwave. And don't forget countertops, tabletops, bathrooms, and computer keyboards.  When to call for help  Call 911 anytime you think you may need emergency care. For example, call if:  You have severe trouble breathing. (You can't talk at all.)  You have constant chest pain or pressure.  You are severely dizzy or lightheaded.  You are confused or can't think clearly.  Your face and lips have a blue color.  You pass out (lose consciousness) or are very hard to wake up.  Call your doctor now if you develop symptoms such as:  Shortness of breath.  Fever.  Cough.  If you need to get care, call ahead to the doctor's office for instructions before you go. Make sure you wear a face mask, if you have one, to prevent exposing other people to the virus.  Where can you get the latest information?  The following health organizations are tracking and studying this virus. Their websites contain the most up-to-date information. You'll also learn what to do if you think you may have been exposed to the virus.  U.S. Centers for Disease Control and Prevention (CDC): The CDC provides updated news about the disease and travel advice. The website also tells you how to prevent the spread of infection. http://www.wolf.info/  World Health Organization [facility]): WHO offers information about the virus outbreaks. WHO also has travel advice. RoleLink.com.br  Current as  of: July 21, 2018               Content Version: 12.4   2006-2020 Healthwise, Incorporated.   Care instructions adapted under license by your healthcare professional. If you have questions about a medical condition or this instruction, always ask your healthcare professional. [place] any warranty or liability for your use of this information.    The discharge information has been reviewed with the patient and caregiver.    Any questions and concerns from the patient and caregiver have been addressed.  The patient and caregiver verbalized understanding.        CONTENTS FOUND IN YOUR DISCHARGE ENVELOPE:  [x]$      Surgeon and Hospital Discharge Instructions  []$      [facility] Care Provider Card  []$   Medication & Side Effect Guide            (your newly prescribed medications have been marked/highlighted showing the most common side effects from   the classes of drugs on your prescriptions)  [x]      Medication Prescription(s) x 3 ( [x]  These have been sent electronically to your pharmacy by your surgeon,   - OR -       your surgeon has already provided these to you during a previous/pre-op office visit)  []      [facility] Education Information  []      Physical Therapy Prescription  []      Follow-up Appointment Cards  []      Surgery-related Pictures/Media  []      Pain block and/or block with On-Q Catheter from Anesthesia Service (information included in your instructions above)  []      Medical device information sheets/pamphlets from their manufacturer   []      School/work excuse note.  []      Driver/parent work excuse note.      The following personal items collected during your admission are returned to you:   Dental Appliance:    Vision:    Hearing Aid:    Jewelry:    Clothing:    Other Valuables:              Going Home After A  Peripheral Nerve Block    Patient Information    The anesthesiology team has provided for your pain control through a technique called regional  anesthesia.  As the name implies, anesthesia (decreased or no pain, sensation, or motor control) has been provided to a specific region, whether that be an arm or a leg.  "How does this happen?" you might ask.    While you were sleepy, the anesthesia provider provided medicine to a specific group of nerves either in the neck/shoulder region or in the groin and/or buttock region, similar to the way a dentist might numb a tooth (teeth.)  They typically use an ultrasound machine to know where the medicine is placed in relation to the nerves they wish to "numb up" or "block."  What this means is that for the next few hours, you should expect to have a numb limb.    Below are some things we wish for you to read and be familiar with concerning your anesthetized limb.    Caring For a Blocked Body Part    General Considerations:  The numbness may last up to 24 hours  You must protect your arm or leg.  The blocked extremity is numb, weak, and difficult for your brain to locate and communicate with.  To do this you should:  Pay attention to the position of the blocked limb at all times.  Be very careful when placing hot or cod items on a numb limb.  You could cause tissue damage like burns or frostbite if you are unable to feel temperature.  Carefully follow your discharge instructions regarding the use of these therapies in you post-operative care.  Carefully pad the affected limb.  Normally we continually move and adjust the position of our bodies without thinking about it.  This movement and continuous repositioning helps to prevent injuries from immobility.  When a limb is numb it still requires this care  Be extremely careful not to bump or hit the numb body part.  This can result in an unrecognized injury, at lease until the blocked limb wakes up.  It can also  result in worse pain of your already post-surgical limb.    Arm/Shoulder Blocks:  You may experience a droopy eyelid, nasal stuffiness, and redness of the eye  after receiving an arm/shoulder block.  This is called Horner's Syndrome, and is very common.  There is no need for concern.  You may also experience mild hoarseness, but all of these symptoms should resolve within 24 hours.  Some patients may experience mild shortness of breath.  A sitting position will help alleviate this and it should resolve within 24 hours.  If you experience significant or progressive worsening of the shortness of breath, seek medical attention immediately.    Knee/Foot Blocks:  DO NOT use the blocked leg to walk, balance or support yourself. Your leg will not be able to balance your weight properly while any part of your leg is numb.  You are at a RISK for [place].  Be careful not to drag your foot along the ground or stub your toes.    Contact Phone Numbers    CALL 911 IN CASE OF AN EMERGENCY.    For all other non-emergency inquiries call the [place] operator at [phone] and ask for the anesthesiologist on call to be paged.

## 2022-01-08 NOTE — Other (Signed)
POST ANESTHESIA CARE    DISCHARGE / TRANSFER NOTE  Jason Shah was:    [x]  discharged        via   [x]  Wheelchair          to [x]  Private Vehicle     []  transferred    []  Carried    []  Taxi / [employer]"  []  Walk out   []  Ambulance / Medical Transportation   []  Stretcher   []  Hospital room _**_           []  Bed      Patient was escorted by:      [x]  Nurse   []  Volunteer  []  Transporter / Technician  []  Parent      []  Spouse / Family / Driver     Patient verbalized     [x]  appreciation and was very pleased with care received   []  frustration with care received       throughout their stay.    Patient was discharged in     [x]  pleasant mood  []  sad mood  []  mad mood .     Pain at discharge/transfer was      0  /10.    Discharge, medication and follow-up instructions were verbalized as understood prior to discharge  (if applicable for same-day procedures being discharged.)    All personal belongings have been returned to patient, and patient/family verbally confirm receiving belongings as all present.

## 2022-01-08 NOTE — Brief Op Note (Signed)
Brief Postoperative Note      Patient: Jason Shah  Date of Birth: Oct 24, 1965  MRN: [id number]    Date of Procedure: 01/14/2022    Pre-Op Diagnosis Codes:     * Cubital tunnel syndrome, unspecified laterality [G56.20]     * Bilateral carpal tunnel syndrome [G56.03]    Post-Op Diagnosis: Same       Procedure(s):  RIGHT ULNAR NERVE RELEASE WITH TRANSPOSITION + RIGHT CARPAL TUNNEL RELEASE (ANES REGIONAL BLOCK WITH MAC)  .    Surgeon(s):  [name], MD    Assistant:  * No surgical staff found *    Anesthesia: Monitor Anesthesia Care    Estimated Blood Loss (mL): Minimal    Complications: None    Specimens:   * No specimens in log *    Implants:  * No implants in log *      Drains: * No LDAs found *    Findings: compression of both nerves      Electronically signed by Carlisle Cater, MD on 2022/01/14 at 7:36 AM

## 2022-01-08 NOTE — Anesthesia Post-Procedure Evaluation (Signed)
Department of Anesthesiology  Postprocedure Note    Patient: Jason Shah  MRN: [id number]  Birthdate: 08-22-1965  Date of evaluation: 01/08/2022      Procedure Summary     Date: 01/08/22 Room / Location: [facility] OR C1 / [facility]    Anesthesia Start: 0732 Anesthesia Stop: 2947    Procedures:       RIGHT ULNAR NERVE RELEASE WITH TRANSPOSITION + RIGHT CARPAL TUNNEL RELEASE (ANES REGIONAL BLOCK WITH MAC) (Right: Arm Lower)      . (Right: Arm Upper) Diagnosis:       Cubital tunnel syndrome, unspecified laterality      Bilateral carpal tunnel syndrome      (Cubital tunnel syndrome, unspecified laterality [G56.20])      (Bilateral carpal tunnel syndrome [G56.03])    Surgeons: [name], MD Responsible Provider: Danella Sensing, MD    Anesthesia Type: regional, TIVA, MAC ASA Status: 3          Anesthesia Type: No value filed.    Aldrete Phase I: Aldrete Score: 9    Aldrete Phase II: Aldrete Score: 10      Anesthesia Post Evaluation    Patient location during evaluation: PACU  Patient participation: complete - patient participated  Level of consciousness: awake  Airway patency: patent  Nausea & Vomiting: no vomiting and no nausea  Complications: no  Cardiovascular status: hemodynamically stable  Respiratory status: acceptable  Hydration status: stable  Pain management: adequate

## 2022-01-08 NOTE — Other (Signed)
PACU-IN REPORT FROM ANESTHESIA    Verbal report received from   Cecille Rubin   _0  MD/DO-Anesthesiologist    _1  CRNA   _2  with student    CHOICE ANESTHESIA:  _3  GENERAL  _4  TIVA  _5  MAC  _6  LOCAL  _7  REGIONAL  _8  SPINAL   _9  EPIDURAL   **Note the anesthesia record for medications given intraoperatively.**           _10  E.R.A.S. PROTOCOL    SURGICAL PROCEDURE: Procedure(s) (LRB):  RIGHT ULNAR NERVE RELEASE WITH TRANSPOSITION + RIGHT CARPAL TUNNEL RELEASE (ANES REGIONAL BLOCK WITH MAC) (Right)  . (Right)     SURGEON: [name], MD.    Brief Initial Visual Assessment:    Patient Age: _11  Infant(1-60mo       _12 Pediatric(1-146yr    _13  Adolescent(13-181yr    _14  Adult(18-65y32yr    _15 Geriatric Adult(>7yr37yr                 Patient    _16  Alert           _17 Calm & Cooperative      _18  Anxious/Restless      _19  Combative  Appearance:  _20  Drowsy      _21  Confused/Disoriented     _22  Sedated      _23  Unresponsive     Oriented x  1            Airway:     _24  Patent          _25  "Difficult Airway" report by Anesthesia                        _26  Obstructs easily/Obstructed on arrival          _27  Manual stimulation and/or airway assistance necessary                         _28  Airway improved with head/airway repositioning                       Airway Adjuncts Present: _29  Oral Airway    _30  Nasal Trumpet    _31  ETT    _32  LMA            Respiratory  _33  Even   _34  Labored   _35  Shallow   _36  Tachypnea (>28 RR/min)  _37  Bradypnea (<10 RR/min)  Pattern:    _38  Non-Labored  _39  VENT and/or respiratory assistance     being provided.        Skin:     _40  Pink _41  Dusky    _42  Pale         _43  Warm     _44  Hot _45  Cool       _46  Cold    _47 Dry     _48  Moist _49  Diaphoretic     Membranes:  _50  Pink    _51  Pale        _52  Moist _53  Dry     _54  Crusty     Pain:   _55  No Acute Discomfort.    0  /10 Scale _56  Verbal Numeric / Visual   _57  Moderate Discomfort.      _58  V.A.S.    _59  Acute Discomfort.                 _60  A.N.V.    _61  Chronic-Issue Related  Discomfort.   _62   F.L.A.C.C.   Note E-MAR for medications administered.  _0 Faces, Wong/Baker    Note assessments documented in flowsheets; any assessment variants to be found in comments or narrative perioperative nurse notes.       Post-anesthesia care now assumed by [facility] BSN, RN-BC

## 2022-02-10 NOTE — Unmapped (Signed)
Formatting of this note might be different from the original.  Pt arrives today after ortho follow up - volumetrics performed. Reports edema continues to be a problem - reports increased edema after 3 hr walk with his wife yesterday.    Volumetrics:    R: 637mL  L: 642mL    He will start a month long steroid pack today to assist with edema management.    Pt to return for edema and ROM check next week.      Electronically signed by Myriam Jacobson, OT at 02/10/2022 11:31 AM EDT

## 2022-02-19 ENCOUNTER — Ambulatory Visit: Admit: 2022-02-19 | Payer: PRIVATE HEALTH INSURANCE | Attending: Internal Medicine | Primary: Internal Medicine

## 2022-02-19 ENCOUNTER — Encounter: Attending: Internal Medicine | Primary: Internal Medicine

## 2022-02-19 DIAGNOSIS — Z0000 Encounter for general adult medical examination without abnormal findings: Secondary | ICD-10-CM

## 2022-02-19 NOTE — Patient Instructions (Signed)
Wait until steroid done to take Flu shot and COVID shot,separately   Return in 1 year for physical

## 2022-02-19 NOTE — Progress Notes (Signed)
Jason Shah is a 56 y.o. wm CC: Routine Physical     HPI     Routine Physical     Past Medical History:   Diagnosis Date    Acute anterior wall MI ([facility]) 06/24/2011    06/21/11. In [place]. Cath:   LAD obstruction   ; tx: 2 stents. Also has obstruction  in  2nd marginal of the LCX  Proximal aortic root dilatation.  Several days after cath: Echo: EF 49 %. .      Allergic rhinitis     Aneurysm of aorta ([facility]) 08/2013    David Procedure    Arthritis     Cervical spondylosis    Asthma     exercise induced as child; RAD while ill    Bicuspid aortic valve 08/01/2013    NON STENOTIC    Bronchitis 12/30/2010    CAD (coronary artery disease)     Cervical disc disease     C5-C6 disc disease.    Depression 12/30/2010    Eczema 12/30/2010    Hypercholesterolemia 06/24/2011    MI (myocardial infarction) ([facility]) 2014    OSA on CPAP     Pneumonia     several episodes in childhood.      Past Surgical History:   Procedure Laterality Date    ARM SURGERY Left 10/09/2021    LEFT ULNAR NERVE RELEASE WITH TRANSPOSITION AND LEFT CARPAL TUNNEL RELEASE performed by [name], MD at [facility] Right 01/08/2022    RIGHT ULNAR NERVE RELEASE WITH TRANSPOSITION + RIGHT CARPAL TUNNEL RELEASE (ANES REGIONAL BLOCK WITH MAC) performed by [name], MD at [facility]  2013    stents x 2    CARPAL TUNNEL RELEASE Right 01/08/2022    . performed by [name], MD at [facility]      age 69 . results negative . f/u 10 years     JOINT REPLACEMENT Left 02/2019    partial knee replacement    KNEE ARTHROSCOPY Deroy Noah/ MENISCECTOMY Left 11/2015    KNEE ARTHROSCOPY Camry Theiss/ MENISCECTOMY Right 07/2019    LIPOMA RESECTION Bilateral     removal of lipomas bilateral arms    ROTATOR CUFF REPAIR Right 11/2019    Kabao Leite/ right long head biceps repair    SHOULDER SURGERY Right 1994    Right bankhardt    THORACIC AORTIC ANEURYSM REPAIR  2015    Had Shanon Brow Procedure at [facility] EXTRACTION       Current Outpatient Medications   Medication Sig Dispense Refill    UNKNOWN TO PATIENT Hydrocodone/APAP  prn . Steroid taper      aspirin 81 MG chewable tablet Take 2 tablets by mouth every morning      Cetirizine HCl (ZYRTEC ALLERGY) 10 MG CAPS Take 10 mg by mouth every morning      ezetimibe (ZETIA) 10 MG tablet 1 tablet every morning      gabapentin (NEURONTIN) 300 MG capsule Take 1 capsule by mouth 2 times daily.      tiZANidine (ZANAFLEX) 2 MG capsule Take 1 capsule by mouth nightly      Fluticasone Propionate (FLONASE NA) by Nasal route as needed      Acetaminophen (TYLENOL EXTRA STRENGTH PO) Take by mouth as needed  atorvastatin (LIPITOR) 80 MG tablet Take 1 tablet by mouth every morning      Naproxen Sodium 220 MG CAPS Take by mouth as needed      albuterol sulfate HFA (PROVENTIL;VENTOLIN;PROAIR) 108 (90 Base) MCG/ACT inhaler INHALE 2 PUFFS BY MOUTH EVERY 4 HOURS AS NEEDED FOR WHEEZING       No current facility-administered medications for this visit.     Allergies   Allergen Reactions    Rosuvastatin Myalgia     Family History   Problem Relation Age of Onset    Asthma Brother     Asthma Mother     Heart Disease Father      Social History     Tobacco Use   Smoking Status Never   Smokeless Tobacco Former    Types: Chew    Quit date: 2020   Tobacco Comments    Dips        Review of Systems  Denies dyspnea.  Usess CPAP    Cardiac:  Denies exertional chest pain . Occasional chest pain Rocket Gunderson/ stress 2-3 minutes responds rest. .   GI: Bowels move regularly without hematochezia.[name]  Urination  slower stream , more frequent vs yesteryear 6 wks R ulnar release; improving post op swelling   R finger #4, 5  improved sensation; currently on a 1  month steroid taper.. yesterday had  rt ear skin bx and rt hand lesion liq ntg by Derm     Physical Examination:  BP 128/72   Temp 96.8 F (36 C)   Ht 1.803 m (_0 )   Wt 102.1 kg (225 lb)   BMI 31.38 kg/m    Physical Exam  General:   Appears in no acute distress.   HEENT:   R ear pinna dsg removed , skin bx site appears clean rest HEENT gative                   Lungs:   Clear        Heart:  Regular without murmur, gallop or rub       Abdomen:   Benign exam without organomegaly or mass palpable                Rectal:  Smooth prostate ; no rectal mass felt    Extremities: R elbow sgy site appears to be healing   R elbow and distally mild swelling   Not able make fist     R dorsal hand healing site s/p Liq NTG by Derm      Neurologic: Decr LT sens in R 4th, 5th fingers  R grip mod weakness 4th, 5th fingers   .    No results found for this or any previous visit (from the past 8 hour(s)).              Results for orders placed or performed in visit on 02/19/22   Urinalysis   Result Value Ref Range    Specific Gravity, UA 1.021 1.005 - 1.030    pH, UA 5.5 5.0 - 7.5    Color, Urine Yellow Yellow    Appearance Clear Clear    Leukocyte Esterase, Urine Negative Negative    Protein, UA Negative Negative/Trace    Glucose, Ur Negative Negative    Ketones, Urine Negative Negative    Blood, Urine Negative Negative    Bilirubin Urine Negative Negative    Urobilinogen, Urine 0.2 0.2 - 1.0 mg/dL    Nitrite, Urine Negative Negative  Lipid Panel   Result Value Ref Range    Cholesterol 208 (H) 100 - 199 mg/dL    Triglycerides 80 0 - 149 mg/dL    HDL 107 >39 mg/dL    VLDL Cholesterol Calculated 14 5 - 40 mg/dL    LDL Calculated 87 0 - 99 mg/dL   CBC with Auto Differential   Result Value Ref Range    WBC 7.6 3.4 - 10.8 x10E3/uL    RBC 5.54 4.14 - 5.80 x10E6/uL    Hemoglobin 17.1 13.0 - 17.7 g/dL    Hematocrit 50.2 37.5 - 51.0 %    MCV 91 79 - 97 fL    MCH 30.9 26.6 - 33.0 pg    MCHC 34.1 31.5 - 35.7 g/dL    RDW 12.9 11.6 - 15.4 %    Platelets 195 150 - 450 x10E3/uL    Neutrophils % 64 Not Estab. %    Lymphocytes % 26 Not Estab. %    Monocytes % 7 Not Estab. %    Eosinophils % 1 Not Estab. %    Basophils % 1 Not Estab. %    Neutrophils  Absolute 4.9 1.4 - 7.0 x10E3/uL    Lymphocytes Absolute 2.0 0.7 - 3.1 x10E3/uL    Monocytes Absolute 0.5 0.1 - 0.9 x10E3/uL    Eosinophils Absolute 0.1 0.0 - 0.4 x10E3/uL    Basophils Absolute 0.0 0.0 - 0.2 x10E3/uL    Immature Granulocytes 1 Not Estab. %    Immature Grans (Abs) 0.1 0.0 - 0.1 x10E3/uL   PSA Screening   Result Value Ref Range    PSA 0.5 0.0 - 4.0 ng/mL   Comprehensive Metabolic Panel   Result Value Ref Range    Glucose 116 (H) 70 - 99 mg/dL    BUN 25 (H) 6 - 24 mg/dL    Creatinine 1.23 0.76 - 1.27 mg/dL    Est, Glomerular Filtration Rate 68 >59 mL/min/1.73    BUN/Creatinine Ratio 20 9 - 20    Sodium 141 134 - 144 mmol/L    Potassium 4.8 3.5 - 5.2 mmol/L    Chloride 103 96 - 106 mmol/L    CO2 26 20 - 29 mmol/L    Calcium 9.7 8.7 - 10.2 mg/dL    Total Protein 6.8 6.0 - 8.5 g/dL    Albumin 4.7 3.8 - 4.9 g/dL    Globulin, Total 2.1 1.5 - 4.5 g/dL    Albumin/Globulin Ratio 2.2 1.2 - 2.2    Total Bilirubin 0.6 0.0 - 1.2 mg/dL    Alkaline Phosphatase 65 44 - 121 IU/L    AST 19 0 - 40 IU/L    ALT 29 0 - 44 IU/L     Assessment/Plan   Diagnosis Orders   1. Routine general medical examination at a health care facility  Urinalysis    Lipid Panel    CBC with Auto Differential    PSA Screening    Comprehensive Metabolic Panel        Recovering R ulnar release . Follow up Ortho Hand  CAD appears clinically stable. Same rx. Follow up Card   Lipids controlled. Same rx   OSAS , continue CPAP  Obesity.- wt down #8 vs 1 y ago P: lose additional  wt via diet , exercise as tolerated  Wait until off steroids to take Flu vax and COVID vax   RV 1 y or prn sooner       Author:  Skip Estimable, MD 6:53 AM11/05/2021

## 2022-02-20 LAB — COMPREHENSIVE METABOLIC PANEL
ALT: 29 IU/L (ref 0–44)
AST: 19 IU/L (ref 0–40)
Albumin/Globulin Ratio: 2.2 (ref 1.2–2.2)
Albumin: 4.7 g/dL (ref 3.8–4.9)
Alkaline Phosphatase: 65 IU/L (ref 44–121)
BUN/Creatinine Ratio: 20 (ref 9–20)
BUN: 25 mg/dL — ABNORMAL HIGH (ref 6–24)
CO2: 26 mmol/L (ref 20–29)
Calcium: 9.7 mg/dL (ref 8.7–10.2)
Chloride: 103 mmol/L (ref 96–106)
Creatinine: 1.23 mg/dL (ref 0.76–1.27)
Est, Glomerular Filtration Rate: 68 mL/min/{1.73_m2} (ref >59)
Globulin, Total: 2.1 g/dL (ref 1.5–4.5)
Glucose: 116 mg/dL — ABNORMAL HIGH (ref 70–99)
Potassium: 4.8 mmol/L (ref 3.5–5.2)
Sodium: 141 mmol/L (ref 134–144)
Total Bilirubin: 0.6 mg/dL (ref 0.0–1.2)
Total Protein: 6.8 g/dL (ref 6.0–8.5)

## 2022-02-20 LAB — CBC WITH AUTO DIFFERENTIAL
Basophils %: 1 %
Basophils Absolute: 0 10*3/uL (ref 0.0–0.2)
Eosinophils %: 1 %
Eosinophils Absolute: 0.1 10*3/uL (ref 0.0–0.4)
Hematocrit: 50.2 % (ref 37.5–51.0)
Hemoglobin: 17.1 g/dL (ref 13.0–17.7)
Immature Grans (Abs): 0.1 10*3/uL (ref 0.0–0.1)
Immature Granulocytes: 1 %
Lymphocytes %: 26 %
Lymphocytes Absolute: 2 10*3/uL (ref 0.7–3.1)
MCH: 30.9 pg (ref 26.6–33.0)
MCHC: 34.1 g/dL (ref 31.5–35.7)
MCV: 91 fL (ref 79–97)
Monocytes %: 7 %
Monocytes Absolute: 0.5 10*3/uL (ref 0.1–0.9)
Neutrophils %: 64 %
Neutrophils Absolute: 4.9 10*3/uL (ref 1.4–7.0)
Platelets: 195 10*3/uL (ref 150–450)
RBC: 5.54 x10E6/uL (ref 4.14–5.80)
RDW: 12.9 % (ref 11.6–15.4)
WBC: 7.6 10*3/uL (ref 3.4–10.8)

## 2022-02-20 LAB — URINALYSIS
Bilirubin Urine: NEGATIVE
Blood, Urine: NEGATIVE
Glucose, Ur: NEGATIVE
Ketones, Urine: NEGATIVE
Leukocyte Esterase, Urine: NEGATIVE
Nitrite, Urine: NEGATIVE
Protein, UA: NEGATIVE
Specific Gravity, UA: 1.021 (ref 1.005–1.030)
Urobilinogen, Urine: 0.2 mg/dL (ref 0.2–1.0)
pH, UA: 5.5 (ref 5.0–7.5)

## 2022-02-20 LAB — LIPID PANEL
Cholesterol: 208 mg/dL — ABNORMAL HIGH (ref 100–199)
HDL: 107 mg/dL (ref >39)
LDL Calculated: 87 mg/dL (ref 0–99)
Triglycerides: 80 mg/dL (ref 0–149)
VLDL Cholesterol Calculated: 14 mg/dL (ref 5–40)

## 2022-02-20 LAB — PSA SCREENING: PSA: 0.5 ng/mL (ref 0.0–4.0)

## 2022-04-02 NOTE — Progress Notes (Signed)
Formatting of this note is different from the original.  Assessment/Plan   Patient had a cubital tunnel release complicated by hematoma formation.  He had initial significant worsening of his ulnar nerve function.  Has had some mild recovery.  Nerve study by Dr. Deniece Portela performed yesterday suggests a neuropraxic and axonotometic injury.  Since his symptoms are starting to improve, this would go against secondary iatrogenic impingement.  At this point, there is no evidence that recurrent scarring is the problem.  I would recommend continued observation.  He is comfortable with this plan and will follow-up with Dr. Riesa Pope and reach out to me again as needed  Problem List:  Patient Active Problem List   Diagnosis   ? Injury of right ulnar nerve         Patient ID: Jason Shah is a 56 y.o. male.  Chief Complaint   Patient presents with   ? Right Hand - Pain, Numbness     S/P right cubital tunnel release and carpal tunnel release in September with continued numbness     History of Present Illness:  Patient presents for second opinion regarding worsening symptoms after undergoing a right cubital tunnel release.  Claudean Severance performed the surgery and I reviewed his notes for the purpose of this visit.  The initial surgery was a right cubital tunnel release with anterior transposition performed on September 20 almost exactly 3 months ago.  The op report does not reveal any complications and hemostasis was noted to be obtained.  However, the patient developed a significant hematoma and diffuse arm swelling in the immediate postoperative..  Patient reports developing fracture blisters.  The ring and little finger became immediately more numb.  He has had a couple Medrol Dosepaks and has improvement in his overall level swelling and has noted some sensory return as well.    Past Medical History:    Diagnosis Date   ? Coronary artery disease    ? Heart attack (CMS/H[facility]) 2014   ? Hyperlipidemia      Surgical History:  Past  Surgical History:   Procedure Laterality Date   ? CARDIAC SURGERY  2015   ? KNEE SURGERY     ? OTHER SURGICAL HISTORY Right 2021    bicep tendon repair and rotator cuff repair   ? SHOULDER SURGERY Right 1994     Social History:  He reports that he has never smoked. He has never used smokeless tobacco. He reports current alcohol use of about 4.0 - 5.0 standard drinks of alcohol per week. He reports that he does not use drugs.    Family History:  family history is not on file.    Current Outpatient Medications on File Prior to Visit   Medication Sig Dispense Refill   ? aspirin 81 mg chewable tablet Chew 2 tablets every morning.     ? atorvastatin (Lipitor) 80 MG tablet Take 80 mg by mouth daily.     ? Cetirizine HCl 10 MG capsule Take 10 mg by mouth daily.     ? ezetimibe (Zetia) 10 MG tablet Take 10 mg by mouth daily.     ? gabapentin (Neurontin) 300 MG capsule TAKE 1 CAPSULE BY MOUTH THREE TIMES DAILY (1 CAPSULE IN THE MORNING, 1 CAPSULE IN THE EVENING AND 1 CAPSULE BEFORE BEDTIME)       No current facility-administered medications on file prior to visit.     Allergies   Allergen Reactions   ? Rosuvastatin Other  cramping    Other Reaction(s): Myalgia      Review of Systems    Height 1.803 m, weight 102.1 kg.  Physical Exam       04/02/2022     8:28 AM   Vitals   Height (in) 1.803 m   Weight (lb) 225   BMI 31.38 kg/m2   BSA (m2) 2.26 m2   Visit Report Report   Well-developed, well-nourished, does not appear to be in significant distress.  Has normal mood and affect.  Looking at the right upper extremity I do not see any significant swelling.  He does have some atrophy of his proximal forearm muscles as well as his first dorsal interosseous though I would classify this as mild.  He has overall good range of motion of his elbow wrist and digits with good extrinsic strength other than little finger FDP flexion which classifies 4/5 and has weak finger abduction strength that I would classify as 4/5.  Palmar abduction  of the thumb was normal.  Sensation is decreased in the ulnar half of the ring finger, the little finger, and ulnar half of the hand.  He is not particular tender to palpation at the elbow and has a mild Tinel's at the distal aspect of the cubital tunnel.  Surgical incision is about 6 or 7 cm long and appears in a appropriate position for the described procedure  Relevant Results:  04/01/22 EMG/NCS Right arm  Electrodiagnostic summary:     Prolonged right median sensory latency with a normal motor study. Unobtainable right ulnar sensory study. Normal right ulnar motor latency however the amplitude was significantly low and even lower with proximal stimulation. The conduction velocity across the elbow is normal. The conduction velocity in the forearm was 28 m/sec. Needle EMG study shows abnormalities in FCU, FDP, and 1st dorsal interosseous all of which could recruit motor units to some degree.    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 Today's study shows a change compared to the study performed by a different provider in November of 2022 in which there was no evidence of axonal degeneration present. The patient is able to recruit motor units in all ulnar innervated muscles. He is 2-1/2 months postop and did have significant swelling and a hematoma and therefore prognosis for recovery over the next many months is likely good.    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 Today's study shows an improvement compared to the study performed by different provider November of last year.      Electronically signed by Clearence Cheek, MD at 04/02/2022  9:35 AM EST

## 2022-06-03 MED ORDER — EZETIMIBE 10 MG PO TABS
10 | ORAL_TABLET | Freq: Every day | ORAL | 3 refills | 90.00000 days | Status: DC
Start: 2022-06-03 — End: 2023-08-28

## 2023-02-24 ENCOUNTER — Ambulatory Visit: Admit: 2023-02-24 | Payer: PRIVATE HEALTH INSURANCE | Attending: Internal Medicine | Primary: Internal Medicine

## 2023-02-24 DIAGNOSIS — Z0000 Encounter for general adult medical examination without abnormal findings: Secondary | ICD-10-CM

## 2023-02-24 NOTE — Progress Notes (Signed)
Jason Shah is a 57 y.o. WM CC: Routine Physical     HPI     Routine Physical     Past Medical History:   Diagnosis Date    Acute anterior wall MI ([facility]) 06/24/2011    06/21/11. In [place]. Cath:   LAD obstruction   ; tx: 2 stents. Also has obstruction  in  2nd marginal of the LCX  Proximal aortic root dilatation.  Several days after cath: Echo: EF 49 %. .      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 In [place]. Cath:   LAD obstruction   ; tx: 2 stents. Also has  obstruction  in  2nd marginal of the LCX  Proximal aortic root dilatation.  Several days after cath: Echo: EF 49 %. .      Allergic rhinitis     Aneurysm of aorta ([facility]) 08/2013    David Procedure    Arthritis     Cervical spondylosis    Asthma     exercise induced as child; RAD while ill    Bicuspid aortic valve 08/01/2013    NON STENOTIC    Bronchitis 12/30/2010    CAD (coronary artery disease)     Cervical disc disease     C5-C6 disc disease.    Depression 12/30/2010    Eczema 12/30/2010    Hypercholesterolemia 06/24/2011    MI (myocardial infarction) ([facility]) 2014    OSA on CPAP     Pneumonia     several episodes in childhood.      Past Surgical History:   Procedure Laterality Date    ARM SURGERY Left 10/09/2021    LEFT ULNAR NERVE RELEASE WITH TRANSPOSITION AND LEFT CARPAL TUNNEL RELEASE performed by Lossie Faes, MD at [facility] AMBULATORY OR    ARM SURGERY Right 01/08/2022    RIGHT ULNAR NERVE RELEASE WITH TRANSPOSITION + RIGHT CARPAL TUNNEL RELEASE (ANES REGIONAL BLOCK WITH MAC) performed by Lossie Faes, MD at [facility] AMBULATORY OR    CARDIAC CATHETERIZATION  2013    stents x 2    CARPAL TUNNEL RELEASE Right 01/08/2022    . performed by Lossie Faes, MD at [facility] AMBULATORY OR    COLONOSCOPY      age 23 . results negative . f/u 10 years     JOINT REPLACEMENT Left 02/2019    partial knee replacement    KNEE ARTHROSCOPY Jenalyn Girdner/ MENISCECTOMY Left 11/2015    KNEE ARTHROSCOPY Aquanetta Schwarz/ MENISCECTOMY Right 07/2019    LIPOMA RESECTION Bilateral     removal of  lipomas bilateral arms    ROTATOR CUFF REPAIR Right 11/2019    Shandria Clinch/ right long head biceps repair    SHOULDER SURGERY Right 1994    Right bankhardt    THORACIC AORTIC ANEURYSM REPAIR  2015    Had [name] Procedure at [facility]    VASECTOMY      VASECTOMY REVERSAL      WISDOM TOOTH EXTRACTION       Current Outpatient Medications   Medication Sig Dispense Refill    ezetimibe (ZETIA) 10 MG tablet Take 1 tablet by mouth once daily 90 tablet 3    Fluticasone Propionate (FLONASE NA) by Nasal route as needed      Acetaminophen (TYLENOL EXTRA STRENGTH PO) Take by mouth as needed      aspirin 81 MG chewable tablet Take 2 tablets by mouth every morning      atorvastatin (LIPITOR) 80  MG tablet Take 1 tablet by mouth every morning      Cetirizine HCl (ZYRTEC ALLERGY) 10 MG CAPS Take 10 mg by mouth every morning      Naproxen Sodium 220 MG CAPS Take by mouth as needed      albuterol sulfate HFA (PROVENTIL;VENTOLIN;PROAIR) 108 (90 Base) MCG/ACT inhaler INHALE 2 PUFFS BY MOUTH EVERY 4 HOURS AS NEEDED FOR WHEEZING       No current facility-administered medications for this visit.     Allergies   Allergen Reactions    Rosuvastatin Myalgia     Family History   Problem Relation Age of Onset    Asthma Brother     Asthma Mother     Heart Disease Father      Social History     Tobacco Use   Smoking Status Never   Smokeless Tobacco Former    Types: Chew    Quit date: 2020   Tobacco Comments    Dips        Review of Systems    Tinnitus 6 mos  more often occurring while  lying recumbent especially at  night. Minor hearing impairment Denies dyspnea. When CPAP not fit then  not sleep well   Cardiac:  Denies chest pain  GI: Bowels move regularly without hematochezia.time to time heartburn.   Urination urgency and slower. Right knee intermittent  pain   Recent skin lesion frozen. Prior  B/l ulnar release and  carpal tunnel  release  still right  hand ulnar pain . Wt fluctuates including 7 # wt change last week Sae Handrich/o ankle swelling  . He had 10  # wt gain a few mos ago  but lost wt down to #225 range. C/o feeling hungry       Physical Examination:  BP 120/78   Pulse 80   Temp 97.2 F (36.2 C)   Ht 1.803 m (5\' 11" )   Wt 102.5 kg (226 lb)   BMI 31.52 kg/m   Physical Exam  General:   Appears in no acute distress.   HEENT:   Negative                   Lungs:   Clear        Heart:  Regular without murmur, gallop or rub       Abdomen:   Benign exam without organomegaly or mass palpable                Rectal:  Smooth prostate no rectal mass felt    Extremities:  R knee mild decreased flexion ; no instability    Neurologic: Decr LT sensation in his right 5th finger and adjacent ulnar forearm.   R 5th finger adduction mild weakness   O/Maki Hege grossly  nonfocal  .    No results found for this or any previous visit (from the past 8 hour(s)).            No results found for this visit on 02/24/23.  Assessment/Plan   Diagnosis Orders   1. Routine general medical examination at a health care facility  Lipid Panel    PSA Screening    CBC with Auto Differential    Comprehensive Metabolic Panel    Urinalysis with Microscopic    Hemoglobin A1c with eAG      2. Need for influenza vaccination          CAD appears clinically stable. F/u Card   Lipids pending  S/p b/l ulnar release and CTS release ; continue conservative care   Tinnitus. Follow . See ENT prn   Mechanical R knee pain. Continue supportive care   Suspect BPH. Check PSA   Obesity. Continue wt loss via diet, exercise.   Flu vaccine IM x 1 given by MA Lanie Schelling/o apparent immediate complications  RV 1 y here for prn sooner     Author:  Nadara Mode, MD [id number] PM11/08/2022

## 2023-02-25 LAB — CBC WITH AUTO DIFFERENTIAL
Basophils %: 1 %
Basophils Absolute: 0 10*3/uL (ref 0.0–0.2)
Eosinophils %: 4 %
Eosinophils Absolute: 0.2 10*3/uL (ref 0.0–0.4)
Hematocrit: 51.2 % — ABNORMAL HIGH (ref 37.5–51.0)
Hemoglobin: 17.3 g/dL (ref 13.0–17.7)
Immature Grans (Abs): 0 10*3/uL (ref 0.0–0.1)
Immature Granulocytes %: 0 %
Lymphocytes %: 30 %
Lymphocytes Absolute: 1.6 10*3/uL (ref 0.7–3.1)
MCH: 31.5 pg (ref 26.6–33.0)
MCHC: 33.8 g/dL (ref 31.5–35.7)
MCV: 93 fL (ref 79–97)
Monocytes %: 10 %
Monocytes Absolute: 0.5 10*3/uL (ref 0.1–0.9)
Neutrophils %: 55 %
Neutrophils Absolute: 2.8 10*3/uL (ref 1.4–7.0)
Platelets: 188 10*3/uL (ref 150–450)
RBC: 5.5 x10E6/uL (ref 4.14–5.80)
RDW: 12.8 % (ref 11.6–15.4)
WBC: 5.1 10*3/uL (ref 3.4–10.8)

## 2023-02-25 LAB — URINALYSIS WITH MICROSCOPIC
Bilirubin, Urine: NEGATIVE
Blood, Urine: NEGATIVE
Glucose, Ur: NEGATIVE
Ketones, Urine: NEGATIVE
Leukocyte Esterase, Urine: NEGATIVE
Nitrite, Urine: NEGATIVE
Protein, UA: NEGATIVE
Specific Gravity, UA: 1.019 (ref 1.005–1.030)
Urobilinogen, Urine: 0.2 mg/dL (ref 0.2–1.0)
pH, Urine: 6 (ref 5.0–7.5)

## 2023-02-25 LAB — COMPREHENSIVE METABOLIC PANEL
ALT: 33 [IU]/L (ref 0–44)
AST: 27 [IU]/L (ref 0–40)
Albumin: 4.6 g/dL (ref 3.8–4.9)
Alkaline Phosphatase: 66 [IU]/L (ref 44–121)
BUN/Creatinine Ratio: 14 (ref 9–20)
BUN: 16 mg/dL (ref 6–24)
CO2: 21 mmol/L (ref 20–29)
Calcium: 9.2 mg/dL (ref 8.7–10.2)
Chloride: 105 mmol/L (ref 96–106)
Creatinine: 1.13 mg/dL (ref 0.76–1.27)
Est, Glom Filt Rate: 75 mL/min/{1.73_m2} (ref >59)
Globulin, Total: 2.2 g/dL (ref 1.5–4.5)
Glucose: 122 mg/dL — ABNORMAL HIGH (ref 70–99)
Potassium: 4.3 mmol/L (ref 3.5–5.2)
Sodium: 140 mmol/L (ref 134–144)
Total Bilirubin: 0.7 mg/dL (ref 0.0–1.2)
Total Protein: 6.8 g/dL (ref 6.0–8.5)

## 2023-02-25 LAB — HEMOGLOBIN A1C W/EAG
Estimated Avg Glucose: 126 mg/dL
Hemoglobin A1C: 6 % — ABNORMAL HIGH (ref 4.8–5.6)

## 2023-02-25 LAB — LIPID PANEL
Cholesterol, Total: 303 mg/dL — ABNORMAL HIGH (ref 100–199)
HDL: 63 mg/dL (ref >39)
LDL Cholesterol: 211 mg/dL — ABNORMAL HIGH (ref 0–99)
Triglycerides: 158 mg/dL — ABNORMAL HIGH (ref 0–149)
VLDL Cholesterol Calculated: 29 mg/dL (ref 5–40)

## 2023-02-25 LAB — MICROSCOPIC EXAMINATION
Bacteria, UA: NONE SEEN
Casts UA: NONE SEEN /[LPF]
Epithelial Cells, UA: NONE SEEN /[HPF] (ref 0–10)
RBC, UA: NONE SEEN /[HPF] (ref 0–2)
WBC, UA: NONE SEEN /[HPF] (ref 0–5)

## 2023-02-25 LAB — PSA SCREENING: PSA: 0.5 ng/mL (ref 0.0–4.0)

## 2023-02-27 NOTE — Telephone Encounter (Signed)
Called pt Jason Shah/ results. He has been off lipid rx for 1 month and recently restarted it . I advised not missing it . I advised ADA diet, grad wt loss via diet, exercise. Rpt CMP, Lipids A1C in 3 months. He will call to set up these labs.

## 2023-05-19 ENCOUNTER — Ambulatory Visit: Admit: 2023-05-19 | Payer: PRIVATE HEALTH INSURANCE | Attending: Internal Medicine | Primary: Internal Medicine

## 2023-05-19 VITALS — BP 132/78 | Temp 97.30000°F | Ht 71.0 in | Wt 218.0 lb

## 2023-05-19 DIAGNOSIS — M5441 Lumbago with sciatica, right side: Secondary | ICD-10-CM

## 2023-05-19 LAB — AMB POC URINALYSIS DIP STICK AUTO W/O MICRO
Bilirubin, Urine, POC: NEGATIVE
Blood (UA POC): NEGATIVE
Glucose, Urine, POC: NEGATIVE mg/dL
KETONES, Urine, POC: NEGATIVE mg/dL
Leukocyte Esterase, Urine, POC: NEGATIVE
Nitrite, Urine, POC: NEGATIVE
Protein, Urine, POC: NEGATIVE mg/dL
Specific Gravity, Urine, POC: 1.005 (ref 1.001–1.035)
Urobilinogen, POC: 0.2 mg/dL (ref <1.1)
pH, Urine, POC: 6.5 (ref 4.6–8.0)

## 2023-05-19 MED ORDER — CYCLOBENZAPRINE HCL 5 MG PO TABS
5 | ORAL_TABLET | Freq: Three times a day (TID) | ORAL | 0 refills | Status: AC | PRN
Start: 2023-05-19 — End: 2023-05-29

## 2023-05-19 NOTE — Progress Notes (Signed)
Jason Shah is a 58 y.o. wm CC R lower back pain     SUBJECTIVE:    Playing tennis frequently in the past  4 mos . C/o 3 mos positional right  lower back pain worse lying , sitting and better walking. In past 3 weeks right sciatic numbness  right leg   Urination ok   Denies trauma .     C/o with his Hemorrhoids hard wipe at times has small amt of bright blood     Past Medical History:   Diagnosis Date    Acute anterior wall MI ([facility]) 06/24/2011    06/21/11. In [place]. Cath:   LAD obstruction   ; tx: 2 stents. Also has obstruction  in  2nd marginal of the LCX  Proximal aortic root dilatation.  Several days after cath: Echo: EF 49 %. .      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 In [place]. Cath:   LAD obstruction   ; tx: 2 stents. Also has  obstruction  in  2nd marginal of the LCX  Proximal aortic root dilatation.  Several days after cath: Echo: EF 49 %. .      Allergic rhinitis     Aneurysm of aorta ([facility]) 08/2013    David Procedure    Arthritis     Cervical spondylosis    Asthma     exercise induced as child; RAD while ill    Bicuspid aortic valve 08/01/2013    NON STENOTIC    Bronchitis 12/30/2010    CAD (coronary artery disease)     Cervical disc disease     C5-C6 disc disease.    Depression 12/30/2010    Eczema 12/30/2010    Hypercholesterolemia 06/24/2011    MI (myocardial infarction) ([facility]) 2014    OSA on CPAP     Pneumonia     several episodes in childhood.      Current Outpatient Medications   Medication Sig Dispense Refill    cyclobenzaprine (FLEXERIL) 5 MG tablet Take 1 tablet by mouth 3 times daily as needed for Muscle spasms 30 tablet 0    ezetimibe (ZETIA) 10 MG tablet Take 1 tablet by mouth once daily 90 tablet 3    Fluticasone Propionate (FLONASE NA) by Nasal route as needed      Acetaminophen (TYLENOL EXTRA STRENGTH PO) Take by mouth as needed      aspirin 81 MG chewable tablet Take 2 tablets by mouth every morning      atorvastatin (LIPITOR) 80 MG tablet Take 1 tablet by mouth every  morning      Cetirizine HCl (ZYRTEC ALLERGY) 10 MG CAPS Take 10 mg by mouth every morning      Naproxen Sodium 220 MG CAPS Take by mouth as needed      albuterol sulfate HFA (PROVENTIL;VENTOLIN;PROAIR) 108 (90 Base) MCG/ACT inhaler INHALE 2 PUFFS BY MOUTH EVERY 4 HOURS AS NEEDED FOR WHEEZING       No current facility-administered medications for this visit.     Allergies   Allergen Reactions    Rosuvastatin Myalgia     Social History     Tobacco Use   Smoking Status Never   Smokeless Tobacco Former    Types: Chew    Quit date: 2020   Tobacco Comments    Dips          Review of Systems  Review of Systems - negative except as per HPI  Physical Examination:  BP 132/78   Temp 97.3 F (36.3 C)   Ht 1.803 m (5\' 11" )   Wt 98.9 kg (218 lb)   BMI 30.40 kg/m   Physical Exam    General: Appears in no acute distress  Lung-clear   Heart - reg  Abdomen -neg   Neg CVAT   Extremities -   + R straight leg raise   R leg neurovascular intact     Labs:   Recent Results (from the past 8 hour(s))   AMB POC URINALYSIS DIP STICK AUTO Negin Hegg/O MICRO    Collection Time: 05/19/23  3:55 PM   Result Value Ref Range    Color (UA POC) Yellow     Clarity (UA POC) Clear     Glucose, Urine, POC Negative Negative mg/dL    Bilirubin, Urine, POC Negative Negative    KETONES, Urine, POC Negative Negative mg/dL    Specific Gravity, Urine, POC 1.005 1.001 - 1.035    Blood (UA POC) Negative     pH, Urine, POC 6.5 4.6 - 8.0    Protein, Urine, POC Negative Negative mg/dL    Urobilinogen, POC 0.2 mg/dL <1.0 mg/dL    Nitrite, Urine, POC Negative Negative    Leukocyte Esterase, Urine, POC Negative Negative       Results for orders placed or performed in visit on 05/19/23   AMB POC URINALYSIS DIP STICK AUTO Rayquon Uselman/O MICRO   Result Value Ref Range    Color (UA POC) Yellow     Clarity (UA POC) Clear     Glucose, Urine, POC Negative Negative mg/dL    Bilirubin, Urine, POC Negative Negative    KETONES, Urine, POC Negative Negative mg/dL    Specific Gravity, Urine,  POC 1.005 1.001 - 1.035    Blood (UA POC) Negative     pH, Urine, POC 6.5 4.6 - 8.0    Protein, Urine, POC Negative Negative mg/dL    Urobilinogen, POC 0.2 mg/dL <9.6 mg/dL    Nitrite, Urine, POC Negative Negative    Leukocyte Esterase, Urine, POC Negative Negative        Assessment/Plan   Diagnosis Orders   1. Kidney pain  AMB POC URINALYSIS DIP STICK AUTO Nisreen Guise/O MICRO      2. Right-sided low back pain with right-sided sciatica, unspecified chronicity          Suspect right back pain is due to sciatica  ER Rx Flexeril.  Use home exercise program that he is familiar with.  Call back here as needed      Author:  Nadara Mode, MD 10:41 PM1/28/2025

## 2023-05-19 NOTE — Patient Instructions (Signed)
Walk  Flexeril   Heat pad   Stretch  Call back if not improving

## 2023-05-20 NOTE — Telephone Encounter (Signed)
Pc called pt Jason Shah/ urinalysis results. He feels improved. Continue same rx. F/u prn

## 2023-07-16 ENCOUNTER — Ambulatory Visit: Admit: 2023-07-16 | Payer: PRIVATE HEALTH INSURANCE | Attending: Internal Medicine | Primary: Internal Medicine

## 2023-07-16 DIAGNOSIS — E7800 Pure hypercholesterolemia, unspecified: Secondary | ICD-10-CM

## 2023-07-16 NOTE — Progress Notes (Signed)
 Lab only

## 2023-07-17 LAB — CBC WITH AUTO DIFFERENTIAL
Basophils %: 1 %
Basophils Absolute: 0 10*3/uL (ref 0.0–0.2)
Eosinophils %: 5 %
Eosinophils Absolute: 0.3 10*3/uL (ref 0.0–0.4)
Hematocrit: 49.4 % (ref 37.5–51.0)
Hemoglobin: 16.6 g/dL (ref 13.0–17.7)
Immature Grans (Abs): 0 10*3/uL (ref 0.0–0.1)
Immature Granulocytes %: 0 %
Lymphocytes %: 35 %
Lymphocytes Absolute: 1.9 10*3/uL (ref 0.7–3.1)
MCH: 31 pg (ref 26.6–33.0)
MCHC: 33.6 g/dL (ref 31.5–35.7)
MCV: 92 fL (ref 79–97)
Monocytes %: 12 %
Monocytes Absolute: 0.7 10*3/uL (ref 0.1–0.9)
Neutrophils %: 47 %
Neutrophils Absolute: 2.5 10*3/uL (ref 1.4–7.0)
Platelets: 224 10*3/uL (ref 150–450)
RBC: 5.36 x10E6/uL (ref 4.14–5.80)
RDW: 13 % (ref 11.6–15.4)
WBC: 5.4 10*3/uL (ref 3.4–10.8)

## 2023-07-17 LAB — LIPID PANEL
Cholesterol, Total: 140 mg/dL (ref 100–199)
HDL: 51 mg/dL (ref >39)
LDL Cholesterol: 76 mg/dL (ref 0–99)
Triglycerides: 61 mg/dL (ref 0–149)
VLDL Cholesterol Calculated: 13 mg/dL (ref 5–40)

## 2023-07-17 LAB — COMPREHENSIVE METABOLIC PANEL
ALT: 30 IU/L (ref 0–44)
AST: 23 IU/L (ref 0–40)
Albumin: 4.6 g/dL (ref 3.8–4.9)
Alkaline Phosphatase: 78 IU/L (ref 44–121)
BUN/Creatinine Ratio: 19 (ref 9–20)
BUN: 22 mg/dL (ref 6–24)
CO2: 23 mmol/L (ref 20–29)
Calcium: 9.3 mg/dL (ref 8.7–10.2)
Chloride: 105 mmol/L (ref 96–106)
Creatinine: 1.18 mg/dL (ref 0.76–1.27)
Est, Glom Filt Rate: 72 mL/min/{1.73_m2} (ref >59)
Globulin, Total: 2.5 g/dL (ref 1.5–4.5)
Glucose: 107 mg/dL — ABNORMAL HIGH (ref 70–99)
Potassium: 4.8 mmol/L (ref 3.5–5.2)
Sodium: 142 mmol/L (ref 134–144)
Total Bilirubin: 0.8 mg/dL (ref 0.0–1.2)
Total Protein: 7.1 g/dL (ref 6.0–8.5)

## 2023-07-17 LAB — HEMOGLOBIN A1C W/EAG
Estimated Avg Glucose: 120 mg/dL
Hemoglobin A1C: 5.8 % — ABNORMAL HIGH (ref 4.8–5.6)

## 2023-07-21 NOTE — Telephone Encounter (Signed)
 Call patient with recent results.  He reports voluntary 30 pound weight loss since a fall.  I advised that he continue ADA diet exercise further weight loss and continue current medications.  Call back here sooner as needed

## 2023-08-28 MED ORDER — EZETIMIBE 10 MG PO TABS
10 | ORAL_TABLET | Freq: Every day | ORAL | 0 refills | 90.00000 days | Status: DC
Start: 2023-08-28 — End: 2024-01-04

## 2023-09-08 ENCOUNTER — Encounter

## 2024-01-04 MED ORDER — EZETIMIBE 10 MG PO TABS
10 | ORAL_TABLET | Freq: Every day | ORAL | 2 refills | 90.00000 days | Status: AC
Start: 2024-01-04 — End: ?

## 2024-02-29 ENCOUNTER — Ambulatory Visit: Admit: 2024-02-29 | Payer: PRIVATE HEALTH INSURANCE | Attending: Internal Medicine | Primary: Internal Medicine

## 2024-02-29 VITALS — BP 136/86 | HR 80 | Temp 97.40000°F | Ht 69.5 in | Wt 217.0 lb

## 2024-02-29 DIAGNOSIS — Z0000 Encounter for general adult medical examination without abnormal findings: Principal | ICD-10-CM

## 2024-02-29 NOTE — Patient Instructions (Signed)
 Stop tobacco  Return in 1 year or sooner as needed
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
## 2024-03-01 ENCOUNTER — Encounter

## 2024-03-01 LAB — CBC WITH AUTO DIFFERENTIAL
Basophils %: 1 %
Basophils Absolute: 0.1 x10E3/uL (ref 0.0–0.2)
Eosinophils %: 6 %
Eosinophils Absolute: 0.3 x10E3/uL (ref 0.0–0.4)
Hematocrit: 50.5 % (ref 37.5–51.0)
Hemoglobin: 17.2 g/dL (ref 13.0–17.7)
Immature Grans (Abs): 0 x10E3/uL (ref 0.0–0.1)
Immature Granulocytes %: 0 %
Lymphocytes %: 31 %
Lymphocytes Absolute: 1.7 x10E3/uL (ref 0.7–3.1)
MCH: 31.7 pg (ref 26.6–33.0)
MCHC: 34.1 g/dL (ref 31.5–35.7)
MCV: 93 fL (ref 79–97)
Monocytes %: 8 %
Monocytes Absolute: 0.5 x10E3/uL (ref 0.1–0.9)
Neutrophils %: 54 %
Neutrophils Absolute: 2.9 x10E3/uL (ref 1.4–7.0)
Platelets: 181 x10E3/uL (ref 150–450)
RBC: 5.42 x10E6/uL (ref 4.14–5.80)
RDW: 12.4 % (ref 11.6–15.4)
WBC: 5.5 x10E3/uL (ref 3.4–10.8)

## 2024-03-01 LAB — URINALYSIS WITH MICROSCOPIC
Bilirubin, Urine: NEGATIVE
Blood, Urine: NEGATIVE
Glucose, Ur: NEGATIVE
Ketones, Urine: NEGATIVE
Leukocyte Esterase, Urine: NEGATIVE
Nitrite, Urine: NEGATIVE
Protein, UA: NEGATIVE
Specific Gravity, UA: 1.022 (ref 1.005–1.030)
Urobilinogen, Urine: 0.2 mg/dL (ref 0.2–1.0)
pH, Urine: 5.5 (ref 5.0–7.5)

## 2024-03-01 LAB — COMPREHENSIVE METABOLIC PANEL
ALT: 26 IU/L (ref 0–44)
AST: 23 IU/L (ref 0–40)
Albumin: 4.6 g/dL (ref 3.8–4.9)
Alkaline Phosphatase: 68 IU/L (ref 47–123)
BUN/Creatinine Ratio: 17 (ref 9–20)
BUN: 19 mg/dL (ref 6–24)
CO2: 20 mmol/L (ref 20–29)
Calcium: 9.2 mg/dL (ref 8.7–10.2)
Chloride: 108 mmol/L — ABNORMAL HIGH (ref 96–106)
Creatinine: 1.1 mg/dL (ref 0.76–1.27)
Est, Glom Filt Rate: 77 mL/min/1.73 (ref >59)
Globulin, Total: 2.3 g/dL (ref 1.5–4.5)
Glucose: 117 mg/dL — ABNORMAL HIGH (ref 70–99)
Potassium: 4.6 mmol/L (ref 3.5–5.2)
Sodium: 143 mmol/L (ref 134–144)
Total Bilirubin: 0.6 mg/dL (ref 0.0–1.2)
Total Protein: 6.9 g/dL (ref 6.0–8.5)

## 2024-03-01 LAB — LIPID PANEL
Cholesterol, Total: 169 mg/dL (ref 100–199)
HDL: 72 mg/dL (ref >39)
LDL Cholesterol: 85 mg/dL (ref 0–99)
Triglycerides: 63 mg/dL (ref 0–149)
VLDL Cholesterol Calculated: 12 mg/dL (ref 5–40)

## 2024-03-01 LAB — MICROSCOPIC EXAMINATION
Bacteria, UA: NONE SEEN
Casts UA: NONE SEEN /LPF
Epithelial Cells, UA: NONE SEEN /HPF (ref 0–10)
RBC, UA: NONE SEEN /HPF (ref 0–2)
WBC, UA: NONE SEEN /HPF (ref 0–5)

## 2024-03-01 LAB — PSA SCREENING: PSA: 0.5 ng/mL (ref 0.0–4.0)

## 2024-03-01 MED ORDER — NEXLETOL 180 MG PO TABS
180 | ORAL_TABLET | Freq: Every day | ORAL | 11 refills | 90.00000 days | Status: DC
Start: 2024-03-01 — End: 2024-03-09

## 2024-03-01 NOTE — Telephone Encounter (Signed)
"  Call patient with recent lab results.  He denies a history of gout.  We discussed hyperglycemia and I advised ADA diet diet and exercise to try to lose weight.  We discussed Nexletol  which I prescribed as an add-on to atorvastatin and ezetimibe .  He has premature CAD and LDL cholesterol 85 - not at goal .   I advised that he return in 6 to 8 weeks after starting Nexletol  for fasting lipid profile and LFTs.  Also discussed the alternative of adding Repatha or Praluent.  Follow-up to be arranged  "

## 2024-03-09 ENCOUNTER — Encounter

## 2024-03-09 MED ORDER — NEXLETOL 180 MG PO TABS
180 | ORAL_TABLET | Freq: Every day | ORAL | 11 refills | Status: AC
Start: 2024-03-09 — End: ?

## 2024-05-27 ENCOUNTER — Encounter: Attending: Internal Medicine | Primary: Internal Medicine
